# Patient Record
Sex: Male | Born: 1960 | Race: White | Hispanic: No | Marital: Married | Smoking: Current every day smoker
Health system: Southern US, Community
[De-identification: ages and names within clinical notes are randomized; demographics above are authoritative.]

## PROBLEM LIST (undated history)

## (undated) DIAGNOSIS — I4891 Unspecified atrial fibrillation: Secondary | ICD-10-CM

## (undated) DIAGNOSIS — I712 Thoracic aortic aneurysm, without rupture: Secondary | ICD-10-CM

## (undated) DIAGNOSIS — E119 Type 2 diabetes mellitus without complications: Secondary | ICD-10-CM

## (undated) DIAGNOSIS — Q2381 Bicuspid aortic valve: Secondary | ICD-10-CM

## (undated) DIAGNOSIS — E785 Hyperlipidemia, unspecified: Secondary | ICD-10-CM

## (undated) DIAGNOSIS — I639 Cerebral infarction, unspecified: Secondary | ICD-10-CM

## (undated) DIAGNOSIS — D869 Sarcoidosis, unspecified: Secondary | ICD-10-CM

## (undated) DIAGNOSIS — I7121 Aneurysm of the ascending aorta, without rupture: Secondary | ICD-10-CM

## (undated) DIAGNOSIS — Q231 Congenital insufficiency of aortic valve: Secondary | ICD-10-CM

## (undated) HISTORY — PX: OTHER SURGICAL HISTORY: SHX169

## (undated) HISTORY — DX: Thoracic aortic aneurysm, without rupture: I71.2

## (undated) HISTORY — DX: Sarcoidosis, unspecified: D86.9

## (undated) HISTORY — DX: Hyperlipidemia, unspecified: E78.5

## (undated) HISTORY — DX: Bicuspid aortic valve: Q23.81

## (undated) HISTORY — DX: Congenital insufficiency of aortic valve: Q23.1

## (undated) HISTORY — PX: LUNG BIOPSY: SHX232

## (undated) HISTORY — DX: Unspecified atrial fibrillation: I48.91

## (undated) HISTORY — PX: VASECTOMY: SHX75

## (undated) HISTORY — DX: Aneurysm of the ascending aorta, without rupture: I71.21

## (undated) HISTORY — PX: PALATE / UVULA BIOPSY / EXCISION: SUR128

## (undated) HISTORY — DX: Type 2 diabetes mellitus without complications: E11.9

## (undated) HISTORY — PX: TONSILLECTOMY: SUR1361

---

## 2018-01-22 LAB — TSH: TSH: 5 (ref 0.41–5.90)

## 2018-01-22 LAB — LIPID PANEL
Cholesterol: 141 (ref 0–200)
HDL: 49 (ref 35–70)
LDL Cholesterol: 79
Triglycerides: 89 (ref 40–160)

## 2018-01-22 LAB — HEMOGLOBIN A1C: Hemoglobin A1C: 14.4

## 2018-01-22 LAB — BASIC METABOLIC PANEL
BUN: 15 (ref 4–21)
CREATININE: 1.1 (ref 0.6–1.3)

## 2018-02-04 ENCOUNTER — Other Ambulatory Visit: Payer: Self-pay | Admitting: Orthopedic Surgery

## 2018-02-04 DIAGNOSIS — R2232 Localized swelling, mass and lump, left upper limb: Secondary | ICD-10-CM

## 2018-02-11 ENCOUNTER — Ambulatory Visit
Admission: RE | Admit: 2018-02-11 | Discharge: 2018-02-11 | Disposition: A | Discharge status: Home / Self Care | Payer: Managed Care, Other (non HMO) | Source: Ambulatory Visit | Attending: Orthopedic Surgery | Admitting: Orthopedic Surgery

## 2018-02-11 DIAGNOSIS — R2232 Localized swelling, mass and lump, left upper limb: Secondary | ICD-10-CM

## 2018-03-14 ENCOUNTER — Ambulatory Visit: Payer: Managed Care, Other (non HMO) | Admitting: Cardiology

## 2018-03-14 ENCOUNTER — Encounter: Payer: Self-pay | Admitting: Cardiology

## 2018-03-14 VITALS — BP 134/86 | HR 68 | Ht 72.0 in | Wt 166.8 lb

## 2018-03-14 DIAGNOSIS — R011 Cardiac murmur, unspecified: Secondary | ICD-10-CM

## 2018-03-14 DIAGNOSIS — R002 Palpitations: Secondary | ICD-10-CM | POA: Diagnosis not present

## 2018-03-14 DIAGNOSIS — Z8679 Personal history of other diseases of the circulatory system: Secondary | ICD-10-CM | POA: Diagnosis not present

## 2018-03-14 DIAGNOSIS — E1165 Type 2 diabetes mellitus with hyperglycemia: Secondary | ICD-10-CM

## 2018-03-14 DIAGNOSIS — Z862 Personal history of diseases of the blood and blood-forming organs and certain disorders involving the immune mechanism: Secondary | ICD-10-CM

## 2018-03-14 NOTE — Patient Instructions (Addendum)
Medication Instructions:   Your physician recommends that you continue on your current medications as directed. Please refer to the Current Medication list given to you today.  Labwork:  NONE  Testing/Procedures: .Your physician has requested that you have an echocardiogram. Echocardiography is a painless test that uses sound waves to create images of your heart. It provides your doctor with information about the size and shape of your heart and how well your heart's chambers and valves are working. This procedure takes approximately one hour. There are no restrictions for this procedure. Your physician has recommended that you wear an event monitor for 7 days. Event monitors are medical devices that record the heart's electrical activity. Doctors most often us these monitors to diagnose arrhythmias. Arrhythmias are problems with the speed or rhythm of the heartbeat. The monitor is a small, portable device. You can wear one while you do your normal daily activities. This is usually used to diagnose what is causing palpitations/syncope (passing out).  Follow-Up:  Your physician recommends that you schedule a follow-up appointment in: 6 weeks.  Any Other Special Instructions Will Be Listed Below (If Applicable).  If you need a refill on your cardiac medications before your next appointment, please call your pharmacy.

## 2018-03-14 NOTE — Progress Notes (Signed)
Cardiology Office Note  Date: 03/14/2018   ID: Jeffery Beard, DOB 11/08/1960, MRN [id number]  PCP: Jeffery Stabile, MD  Consulting Cardiologist: Jeffery Dell, MD   Chief Complaint  Patient presents with  . Palpitations    History of Present Illness: Jeffery Beard is a 57 y.o. male referred for cardiology consultation by Dr. Margo Beard with palpitations and reported history of atrial fibrillation.  We discussed his history, I have no old records.  He tells me that he was diagnosed with atrial fibrillation back in his 9s, was apparently treated with medical therapy and had recurring events over several years.  He denies ever having undergone a cardioversion or being on any antiarrhythmic medications.  It is unclear that he has had any follow-up for this for several years.  He also reports having a "bicuspid valve," presumably aortic, and again not followed for several years.  He recalls having symptoms at rest while watching television, rapid irregular heartbeat and feeling of tightness in his chest, he thought he was having a "heart attack."  He did not seek medical attention but made a routine visit with his PCP resulting in this referral.  He does not report having similar symptoms since then, but has a longstanding recurring history of palpitations.  He describes a skipped beat at times, other times a more rapid heartbeat.  No dizziness or syncope.  History also includes sarcoidosis, it sounds like he underwent lung biopsy for diagnosis and some type of surgical treatment and subsequent medical therapy.  He has had no follow-up for this in several years.  I personally reviewed his ECG today which shows normal sinus rhythm with PAC.  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  He moved to this area from [place] within the last few years.  Past Medical History:  Diagnosis Date  . Atrial fibrillation (HCC)    Details unclear - patient states diagnosed in his 64s  . Bicuspid aortic  valve   . Hyperlipidemia   . Sarcoidosis   . Type 2 diabetes mellitus (HCC)     Past Surgical History:  Procedure Laterality Date  . LUNG BIOPSY    . Nasal septum surgery    . PALATE / UVULA BIOPSY / EXCISION    . TONSILLECTOMY    . VASECTOMY      Current Outpatient Medications  Medication Sig Dispense Refill  . CHANTIX STARTING MONTH PAK 0.5 MG X 11 & 1 MG X 42 tablet     . Insulin Glargine, 1 Unit Dial, (TOUJEO SOLOSTAR) 300 UNIT/ML SOPN INJECT 65 UNITS [place] QHS    . Insulin Lispro (HUMALOG KWIKPEN) 200 UNIT/ML SOPN      No current facility-administered medications for this visit.    Allergies:  Patient has no allergy information on record.   Social History: The patient  reports that he has been smoking cigarettes. He has been smoking about 1.00 pack per day. He has never used smokeless tobacco. He reports that he does not drink alcohol or use drugs.   Family History: The patient's family history includes COPD in his mother; Cancer - Prostate in his father; High blood pressure in his father and mother.   ROS:  Please see the history of present illness. Otherwise, complete review of systems is positive for none.  All other systems are reviewed and negative.   Physical Exam: VS:  BP 134/86   Pulse 68   Ht 6' (1.829 m)   Wt 166 lb 12.8 oz (  75.7 kg)   SpO2 97%   BMI 22.62 kg/m , BMI Body mass index is 22.62 kg/m.  Wt Readings from Last 3 Encounters:  03/14/18 166 lb 12.8 oz (75.7 kg)    General: Patient appears comfortable at rest. HEENT: Conjunctiva and lids normal, oropharynx clear. Neck: Supple, no elevated JVP or carotid bruits, no thyromegaly. Lungs: Clear to auscultation, nonlabored breathing at rest. Cardiac: Regular rate and rhythm, no S3, 2/6 systolic murmur, no pericardial rub. Abdomen: Soft, nontender, bowel sounds present, no guarding or rebound. Extremities: No pitting edema, distal pulses 2+. Skin: Warm and dry. Musculoskeletal: No  kyphosis. Neuropsychiatric: Alert and oriented x3, affect grossly appropriate.  ECG: There is no old tracing available for comparison.  Recent Labwork:  October 2019: Hemoglobin 16.9, platelets 188, BUN 15, creatinine 1.1, potassium 4.5, AST 14, ALT 18, cholesterol 141, triglycerides 89, HDL 49, LDL 74, hemoglobin A1c 14.4%, TSH 5.0  Assessment and Plan:  1.  Recurring palpitations, chronic and in the setting of reported paroxysmal atrial fibrillation.  I have no old records for review.  His ECG today shows sinus rhythm with a single PAC.  Plan is to obtain a 7-day event monitor, he reports daily symptoms.  Hopefully, this will better characterize whether his symptoms correspond with atrial fibrillation or perhaps just ectopy.  2.  Presumed bicuspid aortic valve.  An echocardiogram will be obtained for further evaluation.  3.  Type 2 diabetes mellitus, following with PCP at this point and with endocrinology consultation pending.  He is on insulin.  4.  Reported history of sarcoidosis, no recent follow-up.  [place]  I recommended that he talk with his PCP about arranging at least a follow-up chest x-ray.   Current medicines were reviewed with the patient today.   Orders Placed This Encounter  Procedures  . CARDIAC EVENT MONITOR  . EKG 12-Lead  . ECHOCARDIOGRAM COMPLETE    Disposition: Follow-up in 6 weeks.  Signed, Jonelle SidleSamuel G. McDowell, MD, [facility]ACC 03/14/2018 3:38 PM    [facility] [address], [place], [place] [address] Phone: [phone]; Fax: [phone]

## 2018-03-16 ENCOUNTER — Ambulatory Visit (HOSPITAL_COMMUNITY): Payer: Managed Care, Other (non HMO)

## 2018-03-17 ENCOUNTER — Ambulatory Visit (HOSPITAL_COMMUNITY)
Admission: RE | Admit: 2018-03-17 | Discharge: 2018-03-17 | Disposition: A | Discharge status: Home / Self Care | Payer: Managed Care, Other (non HMO) | Source: Ambulatory Visit | Attending: Cardiology | Admitting: Cardiology

## 2018-03-17 DIAGNOSIS — R011 Cardiac murmur, unspecified: Secondary | ICD-10-CM | POA: Diagnosis present

## 2018-03-17 NOTE — Progress Notes (Signed)
*  PRELIMINARY RESULTS* Echocardiogram 2D Echocardiogram has been performed.  Stacey DrainWhite, Solyana Nonaka J 03/17/2018, 3:53 PM

## 2018-03-22 ENCOUNTER — Telehealth: Payer: Self-pay

## 2018-03-22 ENCOUNTER — Telehealth: Payer: Self-pay | Admitting: Cardiology

## 2018-03-22 NOTE — Telephone Encounter (Signed)
Result released

## 2018-03-22 NOTE — Telephone Encounter (Signed)
Patient is asking when the results for his Echo will be uploaded to MyChart. / tg

## 2018-03-22 NOTE — Telephone Encounter (Signed)
Results released to mychart

## 2018-03-23 ENCOUNTER — Encounter: Payer: Self-pay | Admitting: "Endocrinology

## 2018-03-23 ENCOUNTER — Ambulatory Visit (INDEPENDENT_AMBULATORY_CARE_PROVIDER_SITE_OTHER): Payer: Managed Care, Other (non HMO) | Admitting: "Endocrinology

## 2018-03-23 VITALS — BP 135/76 | HR 68 | Ht 72.0 in | Wt 162.0 lb

## 2018-03-23 DIAGNOSIS — E1165 Type 2 diabetes mellitus with hyperglycemia: Secondary | ICD-10-CM | POA: Diagnosis not present

## 2018-03-23 DIAGNOSIS — E782 Mixed hyperlipidemia: Secondary | ICD-10-CM

## 2018-03-23 DIAGNOSIS — F17200 Nicotine dependence, unspecified, uncomplicated: Secondary | ICD-10-CM

## 2018-03-23 NOTE — Patient Instructions (Signed)
                                       Advice for diabetes /Weight Management   -Sticking to a routine mealtime to eat 3 meals a day and avoiding unnecessary snacks is shown to have a big role in weight control.  -It is better to avoid simple carbohydrates including: Cakes, Sweet Desserts, Ice Cream, Soda (diet and regular), Sweet Tea, Candies, Chips, Cookies, Store Bought Juices, Alcohol in Excess of  1-2 drinks a day, Artificial Sweeteners, Doughnuts, Coffee Creamers, "Sugar-free" Products, etc, etc.  This is not a complete list...Marland Kitchen..    -Consulting with certified diabetes educators is proven to provide you with the most accurate and current information on diet.  Also, you may be  interested in discussing diet options/exchanges , we can schedule a visit with Norm SaltPenny Crumpton, RDN, CDE for individualized nutrition education.     - Studies showed that people with diabetes will benefit from a class of medications known as ACE inhibitors and statins.  Unless there are specific reasons not to be on these medications, the standard of care is to consider getting one from these groups of medications at a minimum dose.  These medications are generally considered safe and proven to help protect the heart and the kidneys.    - People with diabetes are encouraged to initiate and maintain regular follow-up with eye doctors, foot doctors, dentists , and if necessary heart and kidney doctors.     - It is highly recommended that people with diabetes quit smoking or stay away from smoking, and get yearly  flu vaccine and pneumonia vaccine at least every 5 years.  One other important lifestyle recommendation is to ensure adequate sleep - at least 7 hours of uninterrupted sleep at night.  -Exercise: If you are able: 30 -60 minutes a day, 4 days a week, or 150 minutes a week.  The longer the better.  Combine stretch, strength, and aerobic activities.  If you were told in the past  that you have high risk for cardiovascular diseases, you may seek evaluation by your heart doctor prior to initiating moderate to intense exercise programs.

## 2018-03-23 NOTE — Progress Notes (Signed)
Endocrinology Consult Note       03/23/2018, 4:54 PM   Subjective:    Patient ID: Jeffery Beard, male    DOB: 02/11/1961.  Jeffery Beard is being seen in consultation for management of currently uncontrolled symptomatic diabetes requested by  Benita Stabile, MD.   Past Medical History:  Diagnosis Date  . Atrial fibrillation (HCC)    Details unclear - patient states diagnosed in his 98s  . Bicuspid aortic valve   . Hyperlipidemia   . Sarcoidosis   . Type 2 diabetes mellitus (HCC)    Past Surgical History:  Procedure Laterality Date  . LUNG BIOPSY    . Nasal septum surgery    . PALATE / UVULA BIOPSY / EXCISION    . TONSILLECTOMY    . VASECTOMY     Social History   Socioeconomic History  . Marital status: Married    Spouse name: Not on file  . Number of children: Not on file  . Years of education: Not on file  . Highest education level: Not on file  Occupational History  . Not on file  Social Needs  . Financial resource strain: Not on file  . Food insecurity:    Worry: Not on file    Inability: Not on file  . Transportation needs:    Medical: Not on file    Non-medical: Not on file  Tobacco Use  . Smoking status: Current Every Day Smoker    Packs/day: 1.00    Years: 25.00    Pack years: 25.00    Types: Cigarettes  . Smokeless tobacco: Never Used  Substance and Sexual Activity  . Alcohol use: Never    Frequency: Never  . Drug use: Never  . Sexual activity: Not on file  Lifestyle  . Physical activity:    Days per week: Not on file    Minutes per session: Not on file  . Stress: Not on file  Relationships  . Social connections:    Talks on phone: Not on file    Gets together: Not on file    Attends religious service: Not on file    Active member of club or organization: Not on file    Attends meetings of clubs or organizations: Not on file    Relationship status: Not  on file  Other Topics Concern  . Not on file  Social History Narrative  . Not on file   Outpatient Encounter Medications as of 03/23/2018  Medication Sig  . CHANTIX STARTING MONTH PAK 0.5 MG X 11 & 1 MG X 42 tablet   . Insulin Glargine, 1 Unit Dial, (TOUJEO SOLOSTAR) 300 UNIT/ML SOPN Inject 60 Units into the skin at bedtime.  . Insulin Lispro (HUMALOG KWIKPEN) 200 UNIT/ML SOPN Inject 10-16 Units into the skin 3 (three) times daily before meals.   No facility-administered encounter medications on file as of 03/23/2018.     ALLERGIES: Allergies  Allergen Reactions  . Codeine   . Hydrocodone-Acetaminophen     Other reaction(s): Other (See Comments) Nervousness and itching    VACCINATION STATUS:  There is no immunization history on file  for this patient.  Diabetes  He presents for his initial diabetic visit. He has type 2 diabetes mellitus. Onset time: He was diagnosed at approximate age of 50 years. There are no hypoglycemic associated symptoms. Pertinent negatives for hypoglycemia include no confusion, headaches, pallor or seizures. Associated symptoms include polydipsia and polyuria. Pertinent negatives for diabetes include no chest pain, no fatigue, no polyphagia and no weakness. There are no hypoglycemic complications. Symptoms are worsening. Risk factors for coronary artery disease include diabetes mellitus, dyslipidemia, male sex, tobacco exposure and sedentary lifestyle. Current diabetic treatment includes insulin injections (He is currently on Toujeo 40 units twice daily, Humalog 15 units 3 times daily AC.). His weight is fluctuating minimally. He is following a generally unhealthy diet. When asked about meal planning, he reported none. He has not had a previous visit with a dietitian. He rarely participates in exercise. His home blood glucose trend is increasing steadily. (He did not bring any meter nor logs to review.  His most recent A1c was 14.4% on January 22, 2018.) An ACE  inhibitor/angiotensin II receptor blocker is not being taken.  Hyperlipidemia  This is a chronic problem. The current episode started more than 1 year ago. The problem is controlled. Exacerbating diseases include diabetes. Pertinent negatives include no chest pain, myalgias or shortness of breath. Risk factors for coronary artery disease include diabetes mellitus, dyslipidemia, male sex and a sedentary lifestyle.      Review of Systems  Constitutional: Negative for chills, fatigue, fever and unexpected weight change.  HENT: Negative for dental problem, mouth sores and trouble swallowing.   Eyes: Negative for visual disturbance.  Respiratory: Negative for cough, choking, chest tightness, shortness of breath and wheezing.   Cardiovascular: Negative for chest pain, palpitations and leg swelling.  Gastrointestinal: Negative for abdominal distention, abdominal pain, constipation, diarrhea, nausea and vomiting.  Endocrine: Positive for polydipsia and polyuria. Negative for polyphagia.  Genitourinary: Negative for dysuria, flank pain, hematuria and urgency.  Musculoskeletal: Negative for back pain, gait problem, myalgias and neck pain.  Skin: Negative for pallor, rash and wound.  Neurological: Negative for seizures, syncope, weakness, numbness and headaches.  Psychiatric/Behavioral: Negative for confusion and dysphoric mood.    Objective:    BP 135/76   Pulse 68   Ht 6' (1.829 m)   Wt 162 lb (73.5 kg)   BMI 21.97 kg/m   Wt Readings from Last 3 Encounters:  03/23/18 162 lb (73.5 kg)  03/14/18 166 lb 12.8 oz (75.7 kg)     Physical Exam Constitutional:      General: He is not in acute distress.    Appearance: He is well-developed.  HENT:     Head: Normocephalic and atraumatic.  Neck:     Musculoskeletal: Normal range of motion and neck supple.     Thyroid: No thyromegaly.     Trachea: No tracheal deviation.  Cardiovascular:     Rate and Rhythm: Normal rate.     Pulses:           Dorsalis pedis pulses are 1+ on the right side and 1+ on the left side.       Posterior tibial pulses are 1+ on the right side and 1+ on the left side.     Heart sounds: Normal heart sounds, S1 normal and S2 normal. No murmur. No gallop.   Pulmonary:     Effort: No respiratory distress.     Breath sounds: Normal breath sounds. No wheezing.  Abdominal:  General: Bowel sounds are normal. There is no distension.     Palpations: Abdomen is soft.     Tenderness: There is no abdominal tenderness. There is no guarding.  Musculoskeletal:        General: No swelling, deformity or signs of injury.     Right shoulder: He exhibits no swelling and no deformity.     Right lower leg: No edema.     Left lower leg: No edema.     Comments: His foot exam is negative for neuropathy nor PAD.  Skin:    General: Skin is warm and dry.     Findings: No rash.     Nails: There is no clubbing.   Neurological:     Mental Status: He is alert and oriented to person, place, and time.     Cranial Nerves: No cranial nerve deficit.     Sensory: No sensory deficit.     Gait: Gait normal.     Deep Tendon Reflexes: Reflexes are normal and symmetric.  Psychiatric:        Speech: Speech normal.        Behavior: Behavior normal. Behavior is cooperative.        Judgment: Judgment normal.    Recent Results (from the past 2160 hour(s))  Basic metabolic panel     Status: None   Collection Time: 01/22/18 12:00 AM  Result Value Ref Range   BUN 15 4 - 21   Creatinine 1.1 0.6 - 1.3  Lipid panel     Status: None   Collection Time: 01/22/18 12:00 AM  Result Value Ref Range   Triglycerides 89 40 - 160   Cholesterol 141 0 - 200   HDL 49 35 - 70   LDL Cholesterol 79   Hemoglobin A1c     Status: None   Collection Time: 01/22/18 12:00 AM  Result Value Ref Range   Hemoglobin A1C 14.4   TSH     Status: None   Collection Time: 01/22/18 12:00 AM  Result Value Ref Range   TSH 5.00 0.41 - 5.90     Assessment & Plan:    1. Uncontrolled type 2 diabetes mellitus with hyperglycemia (HCC)  - Jeffery Beard has currently uncontrolled symptomatic type 2 DM since 57 years of age,  with most recent A1c of 14.4 %.  Patient denies prior history of diabetes ketoacidosis.  Patient has history of alcohol use/abuse.  His maximum body weight has been 210 pounds with subsequent significant unintentional weight loss down to 147.  Recently regaining to 162.  Recent labs reviewed. - I had a long discussion with him about the progressive nature of diabetes and the pathology behind its complications. -his diabetes is complicated by heavy smoking and he remains at a high risk for more acute and chronic complications which include CAD, CVA, CKD, retinopathy, and neuropathy. These are all discussed in detail with him.  - I have counseled him on diet management by adopting a carbohydrate restricted/protein rich diet.  - Suggestion is made for him to avoid simple carbohydrates  from his diet including Cakes, Sweet Desserts, Ice Cream, Soda (diet and regular), Sweet Tea, Candies, Chips, Cookies, Store Bought Juices, Alcohol in Excess of  1-2 drinks a day, Artificial Sweeteners,  Coffee Creamer, and "Sugar-free" Products. This will help patient to have more stable blood glucose profile and potentially avoid unintended weight gain.  - I encouraged him to switch to  unprocessed or minimally processed complex starch and increased  protein intake (animal or plant source), fruits, and vegetables.  - he is advised to stick to a routine mealtimes to eat 3 meals  a day and avoid unnecessary snacks ( to snack only to correct hypoglycemia).   - he will be scheduled with Norm Salt, RDN, CDE for individualized diabetes education.  Weight loss is not advisable for him.  - I have approached him with the following individualized plan to manage diabetes and patient agrees:   -Given his prevailing glycemic burden, he will continue to require  intensive treatment with basal/bolus insulin. -I discussed and readjusted his Toujeo to 60 units nightly, Humalog 10 units 3 times a day with meals  for pre-meal BG readings of 90-150mg /dl, plus patient specific correction dose for unexpected hyperglycemia above 150mg /dl, associated with strict monitoring of glucose 4 times a day-before meals and at bedtime. - he is warned not to take insulin without proper monitoring per orders. - Adjustment parameters are given to him for hypo and hyperglycemia in writing. - he is encouraged to call clinic for blood glucose levels less than 70 or above 300 mg /dl. -He will be treated exclusively with insulin for now.  His clinical presentation is not typical for type I nor type 2 diabetes.  He will need further studies to classify his diabetes properly.   - Patient specific target  A1c;  LDL, HDL, Triglycerides, and  Waist Circumference were discussed in detail.  2) Blood Pressure /Hypertension:  his blood pressure is controlled to target.  He is not on any blood pressure medications.  3) Lipids/Hyperlipidemia:   Review of his recent lipid panel showed controlled  LDL at 74.  he  is not on statins.  He will be considered for low-dose statin therapy during subsequent visits.     4)  Weight/Diet:  Body mass index is 21.97 kg/m.   Weight loss is not advisable for him.  CDE Consult will be initiated . Exercise, and detailed carbohydrates information provided  -  detailed on discharge instructions.  5) subclinical hypothyroidism -He is currently not on any antithyroid agents nor thyroid hormone supplements.  He will require repeat full profile thyroid function tests on subsequent visits.  This patient also has history of heavy steroid exposure related to his psoriatic arthritis.  This puts him at risk for adrenal insufficiency, will need studies to ensure adrenal sufficiency.  6) Chronic Care/Health Maintenance:  -he  Is not not on ACEI nor  Statin medications,   encouraged to initiate and continue to follow up with Ophthalmology, Dentist,  Podiatrist at least yearly or according to recommendations, and advised to  Quit smoking. I have recommended yearly flu vaccine and pneumonia vaccine at least every 5 years; moderate intensity exercise for up to 150 minutes weekly; and  sleep for at least 7 hours a day.  - he is  advised to maintain close follow up with Benita Stabile, MD for primary care needs, as well as his other providers for optimal and coordinated care.  - Time spent with the patient: 35 minutes, of which >50% was spent in obtaining information about his symptoms, reviewing his previous labs, evaluations, and treatments, counseling him about his uncontrolled diabetes, hyperlipidemia, and developing plans for long term treatment based on the latest recommendations.  Jeffery Beard participated in the discussions, expressed understanding, and voiced agreement with the above plans.  All questions were answered to his satisfaction. he is encouraged to contact clinic should he have any questions or concerns prior  to his return visit.  Follow up plan: - Return in about 10 days (around 04/02/2018) for Follow up with Meter and Logs Only - no Labs.  Jeffery Lunch, MD [facility] [address] [place], [place] [address] Phone: [phone]  Fax: [phone]    03/23/2018, 4:54 PM  This note was partially dictated with voice recognition software. Similar sounding words can be transcribed inadequately or may not  be corrected upon review.

## 2018-03-24 DIAGNOSIS — E1165 Type 2 diabetes mellitus with hyperglycemia: Secondary | ICD-10-CM | POA: Insufficient documentation

## 2018-03-24 DIAGNOSIS — F17200 Nicotine dependence, unspecified, uncomplicated: Secondary | ICD-10-CM | POA: Insufficient documentation

## 2018-03-24 DIAGNOSIS — E782 Mixed hyperlipidemia: Secondary | ICD-10-CM | POA: Insufficient documentation

## 2018-04-05 ENCOUNTER — Ambulatory Visit: Payer: Managed Care, Other (non HMO) | Admitting: "Endocrinology

## 2018-04-11 ENCOUNTER — Encounter: Payer: Self-pay | Admitting: "Endocrinology

## 2018-04-11 ENCOUNTER — Ambulatory Visit: Payer: Managed Care, Other (non HMO) | Admitting: "Endocrinology

## 2018-04-11 VITALS — BP 167/97 | HR 74 | Ht 72.0 in | Wt 171.0 lb

## 2018-04-11 DIAGNOSIS — E1165 Type 2 diabetes mellitus with hyperglycemia: Secondary | ICD-10-CM | POA: Diagnosis not present

## 2018-04-11 DIAGNOSIS — E782 Mixed hyperlipidemia: Secondary | ICD-10-CM

## 2018-04-11 MED ORDER — INSULIN GLARGINE (1 UNIT DIAL) 300 UNIT/ML ~~LOC~~ SOPN: 60.0000 [IU] | PEN_INJECTOR | Freq: Every day | SUBCUTANEOUS | 2 refills | Status: DC

## 2018-04-11 MED ORDER — INSULIN LISPRO 200 UNIT/ML ~~LOC~~ SOPN: 8.0000 [IU] | PEN_INJECTOR | Freq: Three times a day (TID) | SUBCUTANEOUS | 2 refills | Status: DC

## 2018-04-11 NOTE — Patient Instructions (Signed)

## 2018-04-11 NOTE — Progress Notes (Signed)
Endocrinology follow-up note       04/11/2018, 4:47 PM   Subjective:    Patient ID: Jeffery Beard, male    DOB: 09-16-1960.  Sudarshan Delpino is being seen in consultation for management of currently uncontrolled symptomatic diabetes requested by  Benita Stabile, MD.   Past Medical History:  Diagnosis Date  . Atrial fibrillation (HCC)    Details unclear - patient states diagnosed in his 58s  . Bicuspid aortic valve   . Hyperlipidemia   . Sarcoidosis   . Type 2 diabetes mellitus (HCC)    Past Surgical History:  Procedure Laterality Date  . LUNG BIOPSY    . Nasal septum surgery    . PALATE / UVULA BIOPSY / EXCISION    . TONSILLECTOMY    . VASECTOMY     Social History   Socioeconomic History  . Marital status: Married    Spouse name: Not on file  . Number of children: Not on file  . Years of education: Not on file  . Highest education level: Not on file  Occupational History  . Not on file  Social Needs  . Financial resource strain: Not on file  . Food insecurity:    Worry: Not on file    Inability: Not on file  . Transportation needs:    Medical: Not on file    Non-medical: Not on file  Tobacco Use  . Smoking status: Current Every Day Smoker    Packs/day: 1.00    Years: 25.00    Pack years: 25.00    Types: Cigarettes  . Smokeless tobacco: Never Used  Substance and Sexual Activity  . Alcohol use: Never    Frequency: Never  . Drug use: Never  . Sexual activity: Not on file  Lifestyle  . Physical activity:    Days per week: Not on file    Minutes per session: Not on file  . Stress: Not on file  Relationships  . Social connections:    Talks on phone: Not on file    Gets together: Not on file    Attends religious service: Not on file    Active member of club or organization: Not on file    Attends meetings of clubs or organizations: Not on file    Relationship status: Not  on file  Other Topics Concern  . Not on file  Social History Narrative  . Not on file   Outpatient Encounter Medications as of 04/11/2018  Medication Sig  . CHANTIX STARTING MONTH PAK 0.5 MG X 11 & 1 MG X 42 tablet   . Insulin Glargine, 1 Unit Dial, (TOUJEO SOLOSTAR) 300 UNIT/ML SOPN Inject 60 Units into the skin at bedtime.  . Insulin Lispro (HUMALOG KWIKPEN) 200 UNIT/ML SOPN Inject 8-14 Units into the skin 3 (three) times daily before meals.  . [DISCONTINUED] Insulin Glargine, 1 Unit Dial, (TOUJEO SOLOSTAR) 300 UNIT/ML SOPN Inject 60 Units into the skin at bedtime.  . [DISCONTINUED] Insulin Lispro (HUMALOG KWIKPEN) 200 UNIT/ML SOPN Inject 8-14 Units into the skin 3 (three) times daily before meals.   No facility-administered encounter medications on file as  of 04/11/2018.     ALLERGIES: Allergies  Allergen Reactions  . Codeine   . Hydrocodone-Acetaminophen     Other reaction(s): Other (See Comments) Nervousness and itching    VACCINATION STATUS:  There is no immunization history on file for this patient.  Diabetes  He presents for his follow-up diabetic visit. He has type 2 diabetes mellitus. Onset time: He was diagnosed at approximate age of 50 years. His disease course has been improving. There are no hypoglycemic associated symptoms. Pertinent negatives for hypoglycemia include no confusion, headaches, pallor or seizures. Pertinent negatives for diabetes include no chest pain, no fatigue, no polydipsia, no polyphagia, no polyuria and no weakness. There are no hypoglycemic complications. Symptoms are improving. Risk factors for coronary artery disease include diabetes mellitus, dyslipidemia, male sex, tobacco exposure and sedentary lifestyle. Current diabetic treatment includes insulin injections (He is currently on Toujeo 40 units twice daily, Humalog 15 units 3 times daily AC.). His weight is increasing steadily. He is following a generally unhealthy diet. When asked about meal  planning, he reported none. He has not had a previous visit with a dietitian. He rarely participates in exercise. His home blood glucose trend is fluctuating minimally. (He brings in his logs showing improved glycemic profile.    His most recent A1c was 14.4% on January 22, 2018.  He made Humalog dosing errors, continue to use 10 units of Humalog regardless of his pre-meal blood glucose readings because some random hypoglycemia in the range of 50s and 60s.) An ACE inhibitor/angiotensin II receptor blocker is not being taken.  Hyperlipidemia  This is a chronic problem. The current episode started more than 1 year ago. The problem is controlled. Exacerbating diseases include diabetes. Pertinent negatives include no chest pain, myalgias or shortness of breath. Risk factors for coronary artery disease include diabetes mellitus, dyslipidemia, male sex and a sedentary lifestyle.    Review of Systems  Constitutional: Negative for chills, fatigue, fever and unexpected weight change.  HENT: Negative for dental problem, mouth sores and trouble swallowing.   Eyes: Negative for visual disturbance.  Respiratory: Negative for cough, choking, chest tightness, shortness of breath and wheezing.   Cardiovascular: Negative for chest pain, palpitations and leg swelling.  Gastrointestinal: Negative for abdominal distention, abdominal pain, constipation, diarrhea, nausea and vomiting.  Endocrine: Negative for polydipsia, polyphagia and polyuria.  Genitourinary: Negative for dysuria, flank pain, hematuria and urgency.  Musculoskeletal: Negative for back pain, gait problem, myalgias and neck pain.  Skin: Negative for pallor, rash and wound.  Neurological: Negative for seizures, syncope, weakness, numbness and headaches.  Psychiatric/Behavioral: Negative for confusion and dysphoric mood.    Objective:    BP (!) 167/97   Pulse 74   Ht 6' (1.829 m)   Wt 171 lb (77.6 kg)   BMI 23.19 kg/m   Wt Readings from Last 3  Encounters:  04/11/18 171 lb (77.6 kg)  03/23/18 162 lb (73.5 kg)  03/14/18 166 lb 12.8 oz (75.7 kg)     Physical Exam Constitutional:      General: He is not in acute distress.    Appearance: He is well-developed.  HENT:     Head: Normocephalic and atraumatic.  Neck:     Musculoskeletal: Normal range of motion and neck supple.     Thyroid: No thyromegaly.     Trachea: No tracheal deviation.  Cardiovascular:     Rate and Rhythm: Normal rate.     Pulses:  Dorsalis pedis pulses are 1+ on the right side and 1+ on the left side.       Posterior tibial pulses are 1+ on the right side and 1+ on the left side.     Heart sounds: Normal heart sounds, S1 normal and S2 normal. No murmur. No gallop.   Pulmonary:     Effort: No respiratory distress.     Breath sounds: Normal breath sounds. No wheezing.  Abdominal:     General: Bowel sounds are normal. There is no distension.     Palpations: Abdomen is soft.     Tenderness: There is no abdominal tenderness. There is no guarding.  Musculoskeletal:        General: No swelling, deformity or signs of injury.     Right shoulder: He exhibits no swelling and no deformity.     Right lower leg: No edema.     Left lower leg: No edema.     Comments: His foot exam is negative for neuropathy nor PAD.  Skin:    General: Skin is warm and dry.     Findings: No rash.     Nails: There is no clubbing.   Neurological:     Mental Status: He is alert and oriented to person, place, and time.     Cranial Nerves: No cranial nerve deficit.     Sensory: No sensory deficit.     Gait: Gait normal.     Deep Tendon Reflexes: Reflexes are normal and symmetric.  Psychiatric:        Speech: Speech normal.        Behavior: Behavior normal. Behavior is cooperative.        Judgment: Judgment normal.    Recent Results (from the past 2160 hour(s))  Basic metabolic panel     Status: None   Collection Time: 01/22/18 12:00 AM  Result Value Ref Range   BUN 15  4 - 21   Creatinine 1.1 0.6 - 1.3  Lipid panel     Status: None   Collection Time: 01/22/18 12:00 AM  Result Value Ref Range   Triglycerides 89 40 - 160   Cholesterol 141 0 - 200   HDL 49 35 - 70   LDL Cholesterol 79   Hemoglobin A1c     Status: None   Collection Time: 01/22/18 12:00 AM  Result Value Ref Range   Hemoglobin A1C 14.4   TSH     Status: None   Collection Time: 01/22/18 12:00 AM  Result Value Ref Range   TSH 5.00 0.41 - 5.90     Assessment & Plan:   1. Uncontrolled type 2 diabetes mellitus with hyperglycemia (HCC)  - Milas Kocherhillip Flores has currently uncontrolled symptomatic type 2 DM since 58 years of age.  -He presents with improved glycemic profile despite his most recent A1c of 14.4 %.  Patient denies prior history of diabetes ketoacidosis.  Patient has history of alcohol use/abuse.  His maximum body weight has been 210 pounds with subsequent significant unintentional weight loss down to 147.  Recently regaining to 162.  Recent labs reviewed. - I had a long discussion with him about the progressive nature of diabetes and the pathology behind its complications. -his diabetes is complicated by heavy smoking and he remains at a high risk for more acute and chronic complications which include CAD, CVA, CKD, retinopathy, and neuropathy. These are all discussed in detail with him.  - I have counseled him on diet management by adopting  a carbohydrate restricted/protein rich diet.  Has gained 9 pounds since last visit, which is a good development for him.  -  Suggestion is made for him to avoid simple carbohydrates  from his diet including Cakes, Sweet Desserts / Pastries, Ice Cream, Soda (diet and regular), Sweet Tea, Candies, Chips, Cookies, Store Bought Juices, Alcohol in Excess of  1-2 drinks a day, Artificial Sweeteners, and "Sugar-free" Products. This will help patient to have stable blood glucose profile and potentially avoid unintended weight gain.   - I encouraged  him to switch to  unprocessed or minimally processed complex starch and increased protein intake (animal or plant source), fruits, and vegetables.  - he is advised to stick to a routine mealtimes to eat 3 meals  a day and avoid unnecessary snacks ( to snack only to correct hypoglycemia).   - he will be scheduled with Norm SaltPenny Crumpton, RDN, CDE for individualized diabetes education.  Weight loss is not advisable for him.  - I have approached him with the following individualized plan to manage diabetes and patient agrees:   -Given his prevailing glycemic burden, he will continue to require intensive treatment with basal/bolus insulin. -He is advised to continue Toujeo at 60 units nightly, lower Humalog to 8 units 3 times a day with meals  for pre-meal BG readings of 90-150mg /dl, plus patient specific correction dose for unexpected hyperglycemia above 150mg /dl, associated with strict monitoring of glucose 4 times a day-before meals and at bedtime. - he is warned not to take insulin without proper monitoring per orders. -Reportedly, he developed severe skin reaction when he applied his 14 days CGM device, and would like to avoid it for now. - Adjustment parameters are given to him for hypo and hyperglycemia in writing. - he is encouraged to call clinic for blood glucose levels less than 70 or above 300 mg /dl. -He will be treated exclusively with insulin for now.  His clinical presentation is not typical for type I nor type 2 diabetes.  He will need further studies to classify his diabetes properly.   - Patient specific target  A1c;  LDL, HDL, Triglycerides, and  Waist Circumference were discussed in detail.  2) Blood Pressure /Hypertension:  his blood pressure is difficult above target today he is not on antihypertensive medications for now.  He will be considered for low-dose lisinopril next visit if blood pressure remains above target.  This patient remains regular smoker.   3)  Lipids/Hyperlipidemia:   Review of his recent lipid panel showed controlled  LDL at 74.  he  is not on statins.  He will be considered for low-dose statin therapy during subsequent visits.     4)  Weight/Diet:  Body mass index is 23.19 kg/m.   Weight loss is not advisable for him.  CDE Consult will be initiated . Exercise, and detailed carbohydrates information provided  -  detailed on discharge instructions.  5) subclinical hypothyroidism -He is currently not on any antithyroid agents nor thyroid hormone supplements.  He will require repeat full profile thyroid function tests on subsequent visits.  This patient also has history of heavy steroid exposure related to his psoriatic arthritis.  This puts him at risk for adrenal insufficiency, will need studies to ensure adrenal sufficiency.  6) Chronic Care/Health Maintenance:  -he  Is not not on ACEI nor  Statin medications,  encouraged to initiate and continue to follow up with Ophthalmology, Dentist,  Podiatrist at least yearly or according to recommendations, and advised to  Quit smoking. I have recommended yearly flu vaccine and pneumonia vaccine at least every 5 years; moderate intensity exercise for up to 150 minutes weekly; and  sleep for at least 7 hours a day.  - he is  advised to maintain close follow up with Benita Stabile, MD for primary care needs, as well as his other providers for optimal and coordinated care.  - Time spent with the patient: 25 min, of which >50% was spent in reviewing his blood glucose logs , discussing his hypo- and hyper-glycemic episodes, reviewing his current and  previous labs and insulin doses and developing a plan to avoid hypo- and hyper-glycemia. Please refer to Patient Instructions for Blood Glucose Monitoring and Insulin/Medications Dosing Guide"  in media tab for additional information. Milas Kocher participated in the discussions, expressed understanding, and voiced agreement with the above plans.  All  questions were answered to his satisfaction. he is encouraged to contact clinic should he have any questions or concerns prior to his return visit.   Follow up plan: - Return in about 9 weeks (around 06/13/2018) for Meter, and Logs, Follow up with Pre-visit Labs, Meter, and Logs.  Marquis Lunch, MD [facility] [address] [place], [place] [address] Phone: [phone]  Fax: [phone]    04/11/2018, 4:47 PM  This note was partially dictated with voice recognition software. Similar sounding words can be transcribed inadequately or may not  be corrected upon review.

## 2018-04-15 ENCOUNTER — Encounter: Payer: Managed Care, Other (non HMO) | Admitting: Vascular Surgery

## 2018-04-21 ENCOUNTER — Telehealth: Payer: Self-pay

## 2018-04-21 NOTE — Telephone Encounter (Signed)
Patient is aware of recommendation 

## 2018-04-21 NOTE — Telephone Encounter (Signed)
Advise him to lower Toujeo to 50 units nightly, lower Humalog to 5 units 3 times a day with meals  for pre-meal BG readings of 90-150mg /dl, plus  correction for  hyperglycemia above 150mg /dl. He must stick to plan  strict monitoring of glucose 4 times a day-before meals and at bedtime.

## 2018-04-21 NOTE — Telephone Encounter (Signed)
Jeffery Beard is stating that his blood sugar is dropping really low at times but he also admitted to skipping meals on a daily basis  Sun 01/12 AM-264 Before lunch-245 Before dinner-68  Mon 01/13 Am-335 Before lunch-109 Before Dinner-68  Tues 01/14 Did not eat or test in the AM Before Dinner-193 Bedtime-122  Wed 01/15 Before lunch- 77 Before dinner -60  Thurs 01/16 Am-97 After breakfast 53 Has taken 3 glucose tabs and an hr later up to just 63

## 2018-05-06 ENCOUNTER — Ambulatory Visit: Payer: Managed Care, Other (non HMO) | Admitting: Cardiology

## 2018-05-06 ENCOUNTER — Encounter: Payer: Self-pay | Admitting: Cardiology

## 2018-05-06 VITALS — BP 138/98 | HR 76 | Ht 72.0 in | Wt 172.0 lb

## 2018-05-06 DIAGNOSIS — I350 Nonrheumatic aortic (valve) stenosis: Secondary | ICD-10-CM

## 2018-05-06 DIAGNOSIS — R002 Palpitations: Secondary | ICD-10-CM | POA: Diagnosis not present

## 2018-05-06 DIAGNOSIS — Z862 Personal history of diseases of the blood and blood-forming organs and certain disorders involving the immune mechanism: Secondary | ICD-10-CM | POA: Diagnosis not present

## 2018-05-06 DIAGNOSIS — Q231 Congenital insufficiency of aortic valve: Secondary | ICD-10-CM

## 2018-05-06 NOTE — Progress Notes (Signed)
Cardiology Office Note  Date: 05/06/2018   ID: Deni Lefever, DOB 07-Jul-1960, MRN [id number]  PCP: Celene Squibb, MD  Primary Cardiologist: Rozann Lesches, MD    Chief Complaint  Patient presents with  . Aortic valve disease    History of Present Illness: Jeffery Beard is a 58 y.o. male that I met in December 2019.  He is here with his wife for a follow-up visit.  Since initial evaluation he does not report any progressive sense of palpitations, these have actually settled down somewhat.  He has not had any exertional chest pain, no syncope.  Echocardiogram in December 2019 revealed LVEF 60 to 65% with grade 1 diastolic dysfunction, bicuspid aortic valve with moderate stenosis.  I discussed the results with him today.  He has apparently been aware of having a bicuspid aortic valve for many years although has not had regular imaging.  At this point we would anticipate at least a 1 year follow-up with repeat echocardiogram.  He did not obtain the monitor that we ordered at the last visit, apparently there was some difficulty getting it, it was never mailed to him.  He voiced some concerns about the cost and for now was comfortable holding off on further cardiac monitoring.  Past Medical History:  Diagnosis Date  . Atrial fibrillation ([place])    Details unclear - patient states diagnosed in his 45s  . Bicuspid aortic valve   . Hyperlipidemia   . Sarcoidosis   . Type 2 diabetes mellitus ([place])     Past Surgical History:  Procedure Laterality Date  . LUNG BIOPSY    . Nasal septum surgery    . PALATE / UVULA BIOPSY / EXCISION    . TONSILLECTOMY    . VASECTOMY      Current Outpatient Medications  Medication Sig Dispense Refill  . CHANTIX STARTING MONTH PAK 0.5 MG X 11 & 1 MG X 42 tablet     . Insulin Glargine, 1 Unit Dial, (TOUJEO SOLOSTAR) 300 UNIT/ML SOPN Inject 60 Units into the skin at bedtime. 4.5 mL 2  . Insulin Lispro (HUMALOG KWIKPEN) 200 UNIT/ML SOPN Inject 8-14  Units into the skin 3 (three) times daily before meals. 15 mL 2   No current facility-administered medications for this visit.    Allergies:  Codeine and Hydrocodone-acetaminophen   Social History: The patient  reports that he has been smoking cigarettes. He has a 25.00 pack-year smoking history. He has never used smokeless tobacco. He reports that he does not drink alcohol or use drugs.   ROS:  Please see the history of present illness. Otherwise, complete review of systems is positive for none.  All other systems are reviewed and negative.   Physical Exam: VS:  BP (!) 138/98   Pulse 76   Ht 6' (1.829 m)   Wt 172 lb (78 kg)   SpO2 97%   BMI 23.33 kg/m , BMI Body mass index is 23.33 kg/m.  Wt Readings from Last 3 Encounters:  05/06/18 172 lb (78 kg)  04/11/18 171 lb (77.6 kg)  03/23/18 162 lb (73.5 kg)    General: Patient appears comfortable at rest. HEENT: Conjunctiva and lids normal, oropharynx clear. Neck: Supple, no elevated JVP or carotid bruits, no thyromegaly. Lungs: Clear to auscultation, nonlabored breathing at rest. Cardiac: Regular rate and rhythm, no S3, 2/6 systolic murmur, no pericardial rub. Abdomen: Soft, nontender, bowel sounds present. Extremities: No pitting edema, distal pulses 2+. Skin: Warm and dry. Musculoskeletal: No  kyphosis. Neuropsychiatric: Alert and oriented x3, affect grossly appropriate.  ECG: I personally reviewed the tracing from 03/14/2018 which showed sinus rhythm with PAC.  Recent Labwork: 01/22/2018: BUN 15; Creatinine 1.1; TSH 5.00     Component Value Date/Time   CHOL 141 01/22/2018   TRIG 89 01/22/2018   HDL 49 01/22/2018   [place] 79 01/22/2018    Other Studies Reviewed Today:  Echocardiogram 03/17/2018: Study Conclusions  - Left ventricle: The cavity size was normal. Wall thickness was   increased in a pattern of mild LVH. Systolic function was normal.   The estimated ejection fraction was in the range of 60% to 65%.    Wall motion was normal; there were no regional wall motion   abnormalities. Doppler parameters are consistent with abnormal   left ventricular relaxation (grade 1 diastolic dysfunction). - Aortic valve: Moderately calcified annulus. Bicuspid; moderately   calcified leaflets. There was moderate stenosis. There was mild   regurgitation. Mean gradient (S): 13 mm Hg. Peak gradient (S): 21   mm Hg. Valve area (VTI): 1.25 cm^2. - Mitral valve: Mildly thickened leaflets. There was trivial   regurgitation. - Right atrium: Central venous pressure (est): 3 mm Hg. - Tricuspid valve: There was trivial regurgitation. - Pulmonary arteries: PA peak pressure: 21 mm Hg (S). - Pericardium, extracardiac: A prominent pericardial fat pad was   present.  Assessment and Plan:  1.  Bicuspid aortic valve with overall moderate aortic stenosis, mean gradient 13 mmHg.  Patient is not clearly symptomatic at this time, however warrants more regular evaluations.  We will plan at least a one-year clinical visit with repeat echocardiogram, sooner if symptoms escalate.  2.  Intermittent palpitations.  He reports a previous history of possible atrial fibrillation although I do not have any old records for confirmation.  He prefers to hold off on cardiac monitor at this time, I have asked him to let us know if symptoms worsen and he would like to reconsider.  3.  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  This also has not been followed regularly.  I again encouraged him to have his PCP refer him for further evaluation.  4.  Type 2 diabetes mellitus, followed by PCP.  Current medicines were reviewed with the patient today.   Orders Placed This Encounter  Procedures  . ECHOCARDIOGRAM COMPLETE    Disposition: Follow-up in 1 year with repeat echocardiogram.  Signed, Satira Sark, MD, [facility] 05/06/2018 4:51 PM    [place] [facility] [place], [place], [place]  [address] Phone: [phone]; Fax: [phone]

## 2018-05-06 NOTE — Patient Instructions (Addendum)
Medication Instructions:   Your physician recommends that you continue on your current medications as directed. Please refer to the Current Medication list given to you today.  Labwork:  NONE  Testing/Procedures: Your physician has requested that you have an echocardiogram in one year just before your next visit. Echocardiography is a painless test that uses sound waves to create images of your heart. It provides your doctor with information about the size and shape of your heart and how well your heart's chambers and valves are working. This procedure takes approximately one hour. There are no restrictions for this procedure.  Follow-Up:  Your physician recommends that you schedule a follow-up appointment in: 1 year. You will receive a reminder letter in the mail in about 10 months reminding you to call and schedule your appointment. If you don't receive this letter, please contact our office.  Any Other Special Instructions Will Be Listed Below (If Applicable).  If you need a refill on your cardiac medications before your next appointment, please call your pharmacy. 

## 2018-05-17 ENCOUNTER — Other Ambulatory Visit: Payer: Self-pay | Admitting: "Endocrinology

## 2018-05-18 ENCOUNTER — Other Ambulatory Visit: Payer: Self-pay

## 2018-05-18 MED ORDER — INSULIN GLARGINE (1 UNIT DIAL) 300 UNIT/ML ~~LOC~~ SOPN: 60.0000 [IU] | PEN_INJECTOR | Freq: Every day | SUBCUTANEOUS | 0 refills | Status: DC

## 2018-06-20 ENCOUNTER — Ambulatory Visit: Payer: Managed Care, Other (non HMO) | Admitting: "Endocrinology

## 2018-06-23 LAB — COMPREHENSIVE METABOLIC PANEL
ALBUMIN: 4.5 g/dL (ref 3.8–4.9)
ALT: 11 IU/L (ref 0–44)
AST: 13 IU/L (ref 0–40)
Albumin/Globulin Ratio: 2 (ref 1.2–2.2)
Alkaline Phosphatase: 53 IU/L (ref 39–117)
BUN/Creatinine Ratio: 9 (ref 9–20)
BUN: 9 mg/dL (ref 6–24)
Bilirubin Total: 0.5 mg/dL (ref 0.0–1.2)
CO2: 25 mmol/L (ref 20–29)
CREATININE: 0.97 mg/dL (ref 0.76–1.27)
Calcium: 9.4 mg/dL (ref 8.7–10.2)
Chloride: 104 mmol/L (ref 96–106)
GFR calc Af Amer: 100 mL/min/{1.73_m2} (ref >59)
GFR calc non Af Amer: 86 mL/min/{1.73_m2} (ref >59)
GLUCOSE: 113 mg/dL — AB (ref 65–99)
Globulin, Total: 2.3 g/dL (ref 1.5–4.5)
Potassium: 4.2 mmol/L (ref 3.5–5.2)
Sodium: 143 mmol/L (ref 134–144)
Total Protein: 6.8 g/dL (ref 6.0–8.5)

## 2018-06-23 LAB — HEMOGLOBIN A1C
Est. average glucose Bld gHb Est-mCnc: 263 mg/dL
Hgb A1c MFr Bld: 10.8 % — ABNORMAL HIGH (ref 4.8–5.6)

## 2018-06-23 LAB — GLUTAMIC ACID DECARBOXYLASE AUTO ABS: Glutamic Acid Decarb Ab: 15.3 U/mL — ABNORMAL HIGH (ref 0.0–5.0)

## 2018-06-23 LAB — ANTI-ISLET CELL ANTIBODY: Islet Cell Ab: NEGATIVE

## 2018-07-19 ENCOUNTER — Encounter: Payer: Self-pay | Admitting: "Endocrinology

## 2018-07-20 ENCOUNTER — Other Ambulatory Visit: Payer: Self-pay

## 2018-07-20 ENCOUNTER — Ambulatory Visit (INDEPENDENT_AMBULATORY_CARE_PROVIDER_SITE_OTHER): Payer: Managed Care, Other (non HMO) | Admitting: "Endocrinology

## 2018-07-20 ENCOUNTER — Encounter: Payer: Self-pay | Admitting: "Endocrinology

## 2018-07-20 DIAGNOSIS — E782 Mixed hyperlipidemia: Secondary | ICD-10-CM

## 2018-07-20 DIAGNOSIS — E1065 Type 1 diabetes mellitus with hyperglycemia: Secondary | ICD-10-CM

## 2018-07-20 DIAGNOSIS — E139 Other specified diabetes mellitus without complications: Secondary | ICD-10-CM

## 2018-07-20 DIAGNOSIS — E02 Subclinical iodine-deficiency hypothyroidism: Secondary | ICD-10-CM

## 2018-07-20 MED ORDER — INSULIN GLARGINE (1 UNIT DIAL) 300 UNIT/ML ~~LOC~~ SOPN: 60.0000 [IU] | PEN_INJECTOR | Freq: Every day | SUBCUTANEOUS | 0 refills | Status: DC

## 2018-07-20 MED ORDER — INSULIN LISPRO 200 UNIT/ML ~~LOC~~ SOPN: 5.0000 [IU] | PEN_INJECTOR | Freq: Three times a day (TID) | SUBCUTANEOUS | 5 refills | Status: DC

## 2018-07-20 NOTE — Progress Notes (Addendum)
07/20/2018, 2:46 PM        Endocrinology Telehealth Visit Follow up Note -During COVID -19 Pandemic  Patient has verbally consented to this visit, and please refer to the patient's chart for details of arrangement for this telehealth for [facility] .  Subjective:    Patient ID: Jeffery Beard, male    DOB: [birth year].  Lucky Trotta is being engaged in telephone visit for follow-up of the  management of currently uncontrolled symptomatic diabetes requested by  Benita Stabile, MD.   Past Medical History:  Diagnosis Date  . Atrial fibrillation (HCC)    Details unclear - patient states diagnosed in his 40s  . Bicuspid aortic valve   . Hyperlipidemia   . Sarcoidosis   . Type 2 diabetes mellitus (HCC)    Past Surgical History:  Procedure Laterality Date  . LUNG BIOPSY    . Nasal septum surgery    . PALATE / UVULA BIOPSY / EXCISION    . TONSILLECTOMY    . VASECTOMY     Social History   Socioeconomic History  . Marital status: Married    Spouse name: Not on file  . Number of children: Not on file  . Years of education: Not on file  . Highest education level: Not on file  Occupational History  . Not on file  Social Needs  . Financial resource strain: Not on file  . Food insecurity:    Worry: Not on file    Inability: Not on file  . Transportation needs:    Medical: Not on file    Non-medical: Not on file  Tobacco Use  . Smoking status: Current Every Day Smoker    Packs/day: 1.00    Years: 25.00    Pack years: 25.00    Types: Cigarettes  . Smokeless tobacco: Never Used  Substance and Sexual Activity  . Alcohol use: Never    Frequency: Never  . Drug use: Never  . Sexual activity: Not on file  Lifestyle  . Physical activity:    Days per week: Not on file    Minutes per session: Not on file  . Stress: Not on file  Relationships  .  Social connections:    Talks on phone: Not on file    Gets together: Not on file    Attends religious service: Not on file    Active member of club or organization: Not on file    Attends meetings of clubs or organizations: Not on file    Relationship status: Not on file  Other Topics Concern  . Not on file  Social History Narrative  . Not on file   Outpatient Encounter Medications as of 07/20/2018  Medication Sig  . CHANTIX STARTING MONTH PAK 0.5 MG X 11 & 1 MG X 42 tablet   . Insulin Glargine, 1  Unit Dial, (TOUJEO SOLOSTAR) 300 UNIT/ML SOPN Inject 60 Units into the skin at bedtime.  . Insulin Lispro (HUMALOG KWIKPEN) 200 UNIT/ML SOPN Inject 5-14 Units into the skin 3 (three) times daily before meals.  . [DISCONTINUED] Insulin Glargine, 1 Unit Dial, (TOUJEO SOLOSTAR) 300 UNIT/ML SOPN Inject 60 Units into the skin at bedtime. (Patient taking differently: Inject 50 Units into the skin at bedtime. )  . [DISCONTINUED] Insulin Lispro (HUMALOG KWIKPEN) 200 UNIT/ML SOPN Inject 8-14 Units into the skin 3 (three) times daily before meals. (Patient taking differently: Inject 5-14 Units into the skin 3 (three) times daily before meals. )   No facility-administered encounter medications on file as of 07/20/2018.     ALLERGIES: Allergies  Allergen Reactions  . Codeine   . Hydrocodone-Acetaminophen     Other reaction(s): Other (See Comments) Nervousness and itching    VACCINATION STATUS:  There is no immunization history on file for this patient.  Diabetes  He presents for his follow-up diabetic visit. 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 Onset time: He was diagnosed at approximate age of 50 years. His disease course has been improving. There are no hypoglycemic associated symptoms. Pertinent negatives for hypoglycemia include no confusion, headaches, pallor or seizures. Pertinent negatives for diabetes include  no chest pain, no fatigue, no polydipsia, no polyphagia, no polyuria and no weakness. There are no hypoglycemic complications. Symptoms are improving. Risk factors for coronary artery disease include diabetes mellitus, dyslipidemia, male sex, tobacco exposure and sedentary lifestyle. Current diabetic treatment includes insulin injections (He is currently on Toujeo 40 units twice daily, Humalog 15 units 3 times daily AC.). He is following a generally unhealthy diet. When asked about meal planning, he reported none. He has not had a previous visit with a dietitian. He rarely participates in exercise. His home blood glucose trend is fluctuating minimally. His overall blood glucose range is >200 mg/dl. An ACE inhibitor/angiotensin II receptor blocker is not being taken.  Hyperlipidemia  This is a chronic problem. The current episode started more than 1 year ago. The problem is controlled. Exacerbating diseases include diabetes. Pertinent negatives include no chest pain, myalgias or shortness of breath. Risk factors for coronary artery disease include diabetes mellitus, dyslipidemia, male sex and a sedentary lifestyle.     Objective:    There were no vitals taken for this visit.  Wt Readings from Last 3 Encounters:  05/06/18 172 lb (78 kg)  04/11/18 171 lb (77.6 kg)  03/23/18 162 lb (73.5 kg)     Recent Results (from the past 2160 hour(s))  Hemoglobin A1c     Status: Abnormal   Collection Time: 06/20/18  4:26 PM  Result Value Ref Range   Hgb A1c MFr Bld 10.8 (H) 4.8 - 5.6 %    Comment:          Prediabetes: 5.7 - 6.4          Diabetes: >6.4          Glycemic control for adults with diabetes: <7.0    Est. average glucose Bld gHb Est-mCnc 263 mg/dL  Anti-islet cell antibody     Status: None   Collection Time: 06/20/18  4:26 PM  Result Value Ref Range   Islet Cell Ab Negative Neg:<1:1  Glutamic acid decarboxylase auto abs     Status: Abnormal   Collection Time: 06/20/18  4:26 PM  Result Value  Ref Range  Glutamic Acid Decarb Ab 15.3 (H) 0.0 - 5.0 U/mL  Comprehensive metabolic panel     Status: Abnormal   Collection Time: 06/20/18  4:26 PM  Result Value Ref Range   Glucose 113 (H) 65 - 99 mg/dL   BUN 9 6 - 24 mg/dL   Creatinine, Ser 1.61 0.76 - 1.27 mg/dL   GFR calc non Af Amer 86 >59 mL/min/1.73   GFR calc Af Amer 100 >59 mL/min/1.73   BUN/Creatinine Ratio 9 9 - 20   Sodium 143 134 - 144 mmol/L   Potassium 4.2 3.5 - 5.2 mmol/L   Chloride 104 96 - 106 mmol/L   CO2 25 20 - 29 mmol/L   Calcium 9.4 8.7 - 10.2 mg/dL   Total Protein 6.8 6.0 - 8.5 g/dL   Albumin 4.5 3.8 - 4.9 g/dL   Globulin, Total 2.3 1.5 - 4.5 g/dL   Albumin/Globulin Ratio 2.0 1.2 - 2.2   Bilirubin Total 0.5 0.0 - 1.2 mg/dL   Alkaline Phosphatase 53 39 - 117 IU/L   AST 13 0 - 40 IU/L   ALT 11 0 - 44 IU/L     Assessment & Plan:   1. Uncontrolled type 2 diabetes mellitus with hyperglycemia (HCC)  - Stockton Ducre has currently uncontrolled symptomatic diabetes since 58 years of age.  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   -He reports improvement in his symptoms, A1c still high at 10.8% although improved from 14.4%.      Patient denies prior history of diabetes ketoacidosis.  Patient has history of alcohol use/abuse.  His maximum body weight has been 210 pounds with subsequent significant unintentional weight loss down to 147.  Recently regaining to 162.  Recent labs reviewed.   -his diabetes is complicated by heavy smoking and he remains at a high risk for more acute and chronic complications which include CAD, CVA, CKD, retinopathy, and neuropathy. These are all discussed in detail with him.  - I have counseled him on diet management by adopting a carbohydrate restricted/protein rich diet.  Has gained 9 pounds since last visit, which is a good development for him.  -  Suggestion is made for him to avoid simple  carbohydrates  from his diet including Cakes, Sweet Desserts / Pastries, Ice Cream, Soda (diet and regular), Sweet Tea, Candies, Chips, Cookies, Store Bought Juices, Alcohol in Excess of  1-2 drinks a day, Artificial Sweeteners, and "Sugar-free" Products. This will help patient to have stable blood glucose profile and potentially avoid unintended weight gain.   - I encouraged him to switch to  unprocessed or minimally processed complex starch and increased protein intake (animal or plant source), fruits, and vegetables.  - he is advised to stick to a routine mealtimes to eat 3 meals  a day and avoid unnecessary snacks ( to snack only to correct hypoglycemia).   - he has been  scheduled with Norm Salt, RDN, CDE for individualized diabetes education.  Weight loss is not advisable for him.  - I have approached him with the following individualized plan to manage diabetes and patient agrees:   -He will continue to require intensive treatment with basal/bolus insulin in order for him to achieve and maintain control of type 1 diabetes going forward. -He is advised to increase his Toujeo to 60 units nightly, lower Humalog to 5  units 3 times a day with meals  for pre-meal BG readings  of 90-150mg /dl, plus patient specific correction dose for unexpected hyperglycemia above 150mg /dl, associated with strict monitoring of glucose 4 times a day-before meals and at bedtime. - he is warned not to take insulin without proper monitoring per orders. -Reportedly, he developed severe skin reaction when he applied his 14 days CGM device, and would like to avoid it for now. - Adjustment parameters are given to him for hypo and hyperglycemia in writing. - he is encouraged to call clinic for blood glucose levels less than 70 or above 300 mg /dl.  2) Lipids/Hyperlipidemia:   Review of his recent lipid panel showed controlled  LDL at 74.  he  is not on statins.  He will be considered for low-dose statin therapy during  subsequent visits.     3) subclinical hypothyroidism -He is currently not on any antithyroid agents nor thyroid hormone supplements.  He will require repeat full profile thyroid function tests on subsequent visits.  This patient also has history of heavy steroid exposure related to his psoriatic arthritis.  This puts him at risk for adrenal insufficiency, will need studies to ensure adrenal sufficiency.  - he is  advised to maintain close follow up with Benita StabileHall, John Z, MD for primary care needs, as well as his other providers for optimal and coordinated care.  - Patient Care Time Today:  25 min, of which >50% was spent in reviewing his  current and  previous labs/studies, his glycemic profile, previous treatments, and medications doses and developing a plan for long-term care based on the latest recommendations for standards of care.  Milas KocherPhillip Caiazzo participated in the discussions, expressed understanding, and voiced agreement with the above plans.  All questions were answered to his satisfaction. he is encouraged to contact clinic should he have any questions or concerns prior to his return visit.   Follow up plan: - Return in about 3 months (around 10/19/2018) for Follow up with Pre-visit Labs, Meter, and Logs.  Marquis LunchGebre Janmichael Giraud, MD [facility] [address] [place], [place] [address] Phone: [phone]  Fax: [phone]    07/20/2018, 2:46 PM  This note was partially dictated with voice recognition software. Similar sounding words can be transcribed inadequately or may not  be corrected upon review.

## 2018-08-14 ENCOUNTER — Encounter (HOSPITAL_COMMUNITY): Payer: Self-pay | Admitting: Emergency Medicine

## 2018-08-14 ENCOUNTER — Emergency Department (HOSPITAL_COMMUNITY): Payer: Managed Care, Other (non HMO)

## 2018-08-14 ENCOUNTER — Inpatient Hospital Stay (HOSPITAL_COMMUNITY)
Admission: EM | Admit: 2018-08-14 | Discharge: 2018-08-16 | DRG: 309 | Disposition: A | Discharge status: Home / Self Care | Payer: Managed Care, Other (non HMO) | Attending: Internal Medicine | Admitting: Internal Medicine

## 2018-08-14 ENCOUNTER — Inpatient Hospital Stay (HOSPITAL_COMMUNITY): Payer: Managed Care, Other (non HMO)

## 2018-08-14 ENCOUNTER — Other Ambulatory Visit: Payer: Self-pay

## 2018-08-14 DIAGNOSIS — Z825 Family history of asthma and other chronic lower respiratory diseases: Secondary | ICD-10-CM | POA: Diagnosis not present

## 2018-08-14 DIAGNOSIS — I5032 Chronic diastolic (congestive) heart failure: Secondary | ICD-10-CM | POA: Diagnosis present

## 2018-08-14 DIAGNOSIS — I712 Thoracic aortic aneurysm, without rupture, unspecified: Secondary | ICD-10-CM

## 2018-08-14 DIAGNOSIS — Z79899 Other long term (current) drug therapy: Secondary | ICD-10-CM | POA: Diagnosis not present

## 2018-08-14 DIAGNOSIS — I351 Nonrheumatic aortic (valve) insufficiency: Secondary | ICD-10-CM | POA: Diagnosis not present

## 2018-08-14 DIAGNOSIS — D869 Sarcoidosis, unspecified: Secondary | ICD-10-CM

## 2018-08-14 DIAGNOSIS — I4891 Unspecified atrial fibrillation: Secondary | ICD-10-CM

## 2018-08-14 DIAGNOSIS — D860 Sarcoidosis of lung: Secondary | ICD-10-CM | POA: Diagnosis not present

## 2018-08-14 DIAGNOSIS — E785 Hyperlipidemia, unspecified: Secondary | ICD-10-CM | POA: Diagnosis present

## 2018-08-14 DIAGNOSIS — Z794 Long term (current) use of insulin: Secondary | ICD-10-CM | POA: Diagnosis not present

## 2018-08-14 DIAGNOSIS — Z87891 Personal history of nicotine dependence: Secondary | ICD-10-CM | POA: Diagnosis not present

## 2018-08-14 DIAGNOSIS — I10 Essential (primary) hypertension: Secondary | ICD-10-CM

## 2018-08-14 DIAGNOSIS — E119 Type 2 diabetes mellitus without complications: Secondary | ICD-10-CM | POA: Diagnosis not present

## 2018-08-14 DIAGNOSIS — Z1159 Encounter for screening for other viral diseases: Secondary | ICD-10-CM

## 2018-08-14 DIAGNOSIS — Z8673 Personal history of transient ischemic attack (TIA), and cerebral infarction without residual deficits: Secondary | ICD-10-CM | POA: Diagnosis not present

## 2018-08-14 DIAGNOSIS — Z7189 Other specified counseling: Secondary | ICD-10-CM | POA: Diagnosis not present

## 2018-08-14 DIAGNOSIS — E876 Hypokalemia: Secondary | ICD-10-CM | POA: Diagnosis present

## 2018-08-14 DIAGNOSIS — I480 Paroxysmal atrial fibrillation: Principal | ICD-10-CM | POA: Diagnosis present

## 2018-08-14 DIAGNOSIS — I110 Hypertensive heart disease with heart failure: Secondary | ICD-10-CM | POA: Diagnosis present

## 2018-08-14 DIAGNOSIS — Q231 Congenital insufficiency of aortic valve: Secondary | ICD-10-CM

## 2018-08-14 DIAGNOSIS — I340 Nonrheumatic mitral (valve) insufficiency: Secondary | ICD-10-CM | POA: Diagnosis not present

## 2018-08-14 DIAGNOSIS — I350 Nonrheumatic aortic (valve) stenosis: Secondary | ICD-10-CM | POA: Diagnosis present

## 2018-08-14 DIAGNOSIS — E1165 Type 2 diabetes mellitus with hyperglycemia: Secondary | ICD-10-CM | POA: Diagnosis present

## 2018-08-14 LAB — CBC WITH DIFFERENTIAL/PLATELET
Abs Immature Granulocytes: 0.02 10*3/uL (ref 0.00–0.07)
Basophils Absolute: 0.1 10*3/uL (ref 0.0–0.1)
Basophils Relative: 1 %
Eosinophils Absolute: 0.1 10*3/uL (ref 0.0–0.5)
Eosinophils Relative: 1 %
HCT: 48.5 % (ref 39.0–52.0)
Hemoglobin: 16.8 g/dL (ref 13.0–17.0)
Immature Granulocytes: 0 %
Lymphocytes Relative: 16 %
Lymphs Abs: 1.8 10*3/uL (ref 0.7–4.0)
MCH: 30.7 pg (ref 26.0–34.0)
MCHC: 34.6 g/dL (ref 30.0–36.0)
MCV: 88.7 fL (ref 80.0–100.0)
Monocytes Absolute: 0.7 10*3/uL (ref 0.1–1.0)
Monocytes Relative: 7 %
Neutro Abs: 8.1 10*3/uL — ABNORMAL HIGH (ref 1.7–7.7)
Neutrophils Relative %: 75 %
Platelets: 197 10*3/uL (ref 150–400)
RBC: 5.47 MIL/uL (ref 4.22–5.81)
RDW: 12.1 % (ref 11.5–15.5)
WBC: 10.8 10*3/uL — ABNORMAL HIGH (ref 4.0–10.5)
nRBC: 0 % (ref 0.0–0.2)

## 2018-08-14 LAB — BASIC METABOLIC PANEL
Anion gap: 12 (ref 5–15)
BUN: 20 mg/dL (ref 6–20)
CO2: 25 mmol/L (ref 22–32)
Calcium: 9.1 mg/dL (ref 8.9–10.3)
Chloride: 102 mmol/L (ref 98–111)
Creatinine, Ser: 0.84 mg/dL (ref 0.61–1.24)
GFR calc Af Amer: >60 mL/min (ref >60)
GFR calc non Af Amer: >60 mL/min (ref >60)
Glucose, Bld: 207 mg/dL — ABNORMAL HIGH (ref 70–99)
Potassium: 3.6 mmol/L (ref 3.5–5.1)
Sodium: 139 mmol/L (ref 135–145)

## 2018-08-14 LAB — GLUCOSE, CAPILLARY
Glucose-Capillary: 250 mg/dL — ABNORMAL HIGH (ref 70–99)
Glucose-Capillary: 184 mg/dL — ABNORMAL HIGH (ref 70–99)

## 2018-08-14 LAB — MRSA PCR SCREENING: MRSA by PCR: NEGATIVE

## 2018-08-14 LAB — PROTIME-INR
INR: 1 (ref 0.8–1.2)
Prothrombin Time: 13.5 s (ref 11.4–15.2)

## 2018-08-14 LAB — TROPONIN I
Troponin I: <0.03 ng/mL (ref <0.03)
Troponin I: <0.03 ng/mL (ref <0.03)
Troponin I: <0.03 ng/mL

## 2018-08-14 LAB — SARS CORONAVIRUS 2 BY RT PCR (HOSPITAL ORDER, PERFORMED IN ~~LOC~~ HOSPITAL LAB): SARS Coronavirus 2: NEGATIVE

## 2018-08-14 LAB — TSH: TSH: 8.147 u[IU]/mL — ABNORMAL HIGH (ref 0.350–4.500)

## 2018-08-14 LAB — APTT: aPTT: 29 s (ref 24–36)

## 2018-08-14 LAB — HEPARIN LEVEL (UNFRACTIONATED): Heparin Unfractionated: 0.73 [IU]/mL — ABNORMAL HIGH (ref 0.30–0.70)

## 2018-08-14 LAB — CBG MONITORING, ED: Glucose-Capillary: 199 mg/dL — ABNORMAL HIGH (ref 70–99)

## 2018-08-14 MED ORDER — ACETAMINOPHEN 325 MG PO TABS: 650.0000 mg | ORAL_TABLET | Freq: Four times a day (QID) | ORAL | Status: DC | PRN

## 2018-08-14 MED ORDER — METOPROLOL TARTRATE 25 MG PO TABS: 25.0000 mg | ORAL_TABLET | Freq: Two times a day (BID) | ORAL | Status: DC

## 2018-08-14 MED ORDER — INSULIN GLARGINE 100 UNIT/ML ~~LOC~~ SOLN
48.0000 [IU] | Freq: Every day | SUBCUTANEOUS | Status: DC
  Filled 2018-08-14: qty 0.48

## 2018-08-14 MED ORDER — DILTIAZEM LOAD VIA INFUSION
10.0000 mg | Freq: Once | INTRAVENOUS | Status: AC
  Administered 2018-08-14: 10 mg via INTRAVENOUS
  Filled 2018-08-14: qty 10

## 2018-08-14 MED ORDER — HEPARIN BOLUS VIA INFUSION
4500.0000 [IU] | Freq: Once | INTRAVENOUS | Status: AC
  Administered 2018-08-14: 4500 [IU] via INTRAVENOUS

## 2018-08-14 MED ORDER — NON FORMULARY: 60.0000 [IU] | Freq: Every day | Status: DC

## 2018-08-14 MED ORDER — NON FORMULARY: 5.0000 [IU] | Freq: Three times a day (TID) | Status: DC

## 2018-08-14 MED ORDER — INSULIN GLARGINE (1 UNIT DIAL) 300 UNIT/ML ~~LOC~~ SOPN
60.0000 [IU] | PEN_INJECTOR | Freq: Every day | SUBCUTANEOUS | Status: DC
  Administered 2018-08-14 – 2018-08-15 (×2): 60 [IU] via SUBCUTANEOUS

## 2018-08-14 MED ORDER — SODIUM CHLORIDE 0.9% FLUSH: 3.0000 mL | INTRAVENOUS | Status: DC | PRN

## 2018-08-14 MED ORDER — ADENOSINE 6 MG/2ML IV SOLN
6.0000 mg | Freq: Once | INTRAVENOUS | Status: AC
  Administered 2018-08-14: 6 mg via INTRAVENOUS
  Filled 2018-08-14: qty 2

## 2018-08-14 MED ORDER — DILTIAZEM HCL 25 MG/5ML IV SOLN
INTRAVENOUS | Status: AC
  Filled 2018-08-14: qty 5

## 2018-08-14 MED ORDER — ONDANSETRON HCL 4 MG/2ML IJ SOLN: 4.0000 mg | Freq: Four times a day (QID) | INTRAMUSCULAR | Status: DC | PRN

## 2018-08-14 MED ORDER — METOPROLOL TARTRATE 5 MG/5ML IV SOLN
5.0000 mg | Freq: Once | INTRAVENOUS | Status: AC
  Administered 2018-08-14: 5 mg via INTRAVENOUS
  Filled 2018-08-14: qty 5

## 2018-08-14 MED ORDER — VARENICLINE TARTRATE 1 MG PO TABS
1.0000 mg | ORAL_TABLET | Freq: Two times a day (BID) | ORAL | Status: DC
  Administered 2018-08-14 – 2018-08-16 (×4): 1 mg via ORAL
  Filled 2018-08-14 (×6): qty 1

## 2018-08-14 MED ORDER — SODIUM CHLORIDE 0.9 % IV SOLN
250.0000 mL | INTRAVENOUS | Status: DC | PRN
  Administered 2018-08-14: 15:00:00 10 mL via INTRAVENOUS

## 2018-08-14 MED ORDER — DILTIAZEM HCL 100 MG IV SOLR
5.0000 mg/h | INTRAVENOUS | Status: DC
  Administered 2018-08-14 (×2): 5 mg/h via INTRAVENOUS
  Filled 2018-08-14: qty 100

## 2018-08-14 MED ORDER — HEPARIN (PORCINE) 25000 UT/250ML-% IV SOLN
1000.0000 [IU]/h | INTRAVENOUS | Status: DC
  Administered 2018-08-14: 1100 [IU]/h via INTRAVENOUS
  Administered 2018-08-15: 1000 [IU]/h via INTRAVENOUS
  Filled 2018-08-14 (×2): qty 250

## 2018-08-14 MED ORDER — ONDANSETRON HCL 4 MG PO TABS: 4.0000 mg | ORAL_TABLET | Freq: Four times a day (QID) | ORAL | Status: DC | PRN

## 2018-08-14 MED ORDER — DILTIAZEM HCL 100 MG IV SOLR
INTRAVENOUS | Status: AC
  Filled 2018-08-14: qty 100

## 2018-08-14 MED ORDER — INSULIN ASPART 100 UNIT/ML ~~LOC~~ SOLN: 5.0000 [IU] | Freq: Three times a day (TID) | SUBCUTANEOUS | Status: DC

## 2018-08-14 MED ORDER — INSULIN LISPRO (1 UNIT DIAL) 100 UNIT/ML (KWIKPEN)
5.0000 [IU] | PEN_INJECTOR | Freq: Three times a day (TID) | SUBCUTANEOUS | Status: DC
  Administered 2018-08-14: 8 [IU] via SUBCUTANEOUS
  Administered 2018-08-15 (×2): 5 [IU] via SUBCUTANEOUS
  Administered 2018-08-16: 7 [IU] via SUBCUTANEOUS

## 2018-08-14 MED ORDER — ACETAMINOPHEN 650 MG RE SUPP: 650.0000 mg | Freq: Four times a day (QID) | RECTAL | Status: DC | PRN

## 2018-08-14 MED ORDER — METOPROLOL TARTRATE 25 MG PO TABS
25.0000 mg | ORAL_TABLET | Freq: Two times a day (BID) | ORAL | Status: DC
  Administered 2018-08-14: 25 mg via ORAL
  Filled 2018-08-14 (×2): qty 1

## 2018-08-14 MED ORDER — INSULIN ASPART 100 UNIT/ML ~~LOC~~ SOLN: 0.0000 [IU] | Freq: Every day | SUBCUTANEOUS | Status: DC

## 2018-08-14 MED ORDER — INSULIN ASPART 100 UNIT/ML ~~LOC~~ SOLN: 0.0000 [IU] | Freq: Three times a day (TID) | SUBCUTANEOUS | Status: DC

## 2018-08-14 MED ORDER — SODIUM CHLORIDE 0.9% FLUSH
3.0000 mL | Freq: Two times a day (BID) | INTRAVENOUS | Status: DC
  Administered 2018-08-15: 3 mL via INTRAVENOUS

## 2018-08-14 MED ORDER — DILTIAZEM HCL 25 MG/5ML IV SOLN: 10.0000 mg | Freq: Once | INTRAVENOUS | Status: DC

## 2018-08-14 NOTE — ED Provider Notes (Signed)
[facility] EMERGENCY DEPARTMENT Provider Note   CSN: [id number] Arrival date & time: 08/14/18  1013    History   Chief Complaint Chief Complaint  Patient presents with  . Chest Pain    HPI Jeffery Beard is a 58 y.o. male with a distant history of paroxysmal afib, no medications for this as pt states medications never improved his sx, bicuspid aortic valve, sarcoidosis and diabetes woke around 5 am today with palpitations along with sob and weakness.  He denies chest "pain" but reports a percussion type sensation localizing to left chest.  He has had no n/v or abd pain, no dizziness or lightheadedness.  He reports having excessive fatigue when getting some propane tanks filled this am (reports he had to run some errands before could come here). He denies any new medications, he does report chronic daily heavy caffeine intake, but probably less intake over the past 24 hours then his normal amount.  He has found no alleviators for his symptoms.      The history is provided by the patient.    Past Medical History:  Diagnosis Date  . Atrial fibrillation (HCC)    Details unclear - patient states diagnosed in his 66s  . Bicuspid aortic valve   . Hyperlipidemia   . Sarcoidosis   . Type 2 diabetes mellitus [facility])     Patient Active Problem List   Diagnosis Date Noted  . Atrial fibrillation with RVR (HCC) 08/14/2018  . Mixed hyperlipidemia 03/24/2018  . Uncontrolled type 2 diabetes mellitus with hyperglycemia (HCC) 03/24/2018  . Current smoker 03/24/2018    Past Surgical History:  Procedure Laterality Date  . LUNG BIOPSY    . Nasal septum surgery    . PALATE / UVULA BIOPSY / EXCISION    . TONSILLECTOMY    . VASECTOMY          Home Medications    Prior to Admission medications   Medication Sig Start Date End Date Taking? Authorizing Provider  acetaminophen (TYLENOL) 650 MG CR tablet Take 650 mg by mouth every 8 (eight) hours as needed for pain.   Yes [provider]  CHANTIX 1 MG tablet Take 1 tablet by mouth 2 (two) times a day. 06/22/18  Yes [provider]  ibuprofen (ADVIL) 200 MG tablet Take 400-600 mg by mouth every 6 (six) hours as needed.   Yes [provider]  Insulin Glargine, 1 Unit Dial, (TOUJEO SOLOSTAR) 300 UNIT/ML SOPN Inject 60 Units into the skin at bedtime. 07/20/18  Yes Nida, Denman George, MD  Insulin Lispro (HUMALOG KWIKPEN) 200 UNIT/ML SOPN Inject 5-14 Units into the skin 3 (three) times daily before meals. 07/20/18  Yes Roma Kayser, MD    Family History Family History  Problem Relation Age of Onset  . COPD Mother   . High blood pressure Mother   . Cancer - Prostate Father   . High blood pressure Father     Social History Social History   Tobacco Use  . Smoking status: Current Every Day Smoker    Packs/day: 1.00    Years: 25.00    Pack years: 25.00    Types: Cigarettes  . Smokeless tobacco: Never Used  Substance Use Topics  . Alcohol use: Never    Frequency: Never  . Drug use: Never     Allergies   Codeine and Hydrocodone-acetaminophen   Review of Systems Review of Systems  Constitutional: Negative for fever.  HENT: Negative for congestion and sore  throat.   Eyes: Negative.   Respiratory: Positive for shortness of breath. Negative for chest tightness.   Cardiovascular: Positive for palpitations. Negative for chest pain.  Gastrointestinal: Negative for abdominal pain, nausea and vomiting.  Genitourinary: Negative.   Musculoskeletal: Negative for arthralgias, joint swelling and neck pain.  Skin: Negative.  Negative for rash and wound.  Neurological: Positive for weakness. Negative for dizziness, light-headedness, numbness and headaches.  Psychiatric/Behavioral: Negative.      Physical Exam Updated Vital Signs BP 113/74   Pulse (!) 56   Temp 98.6 F (37 C) (Oral)   Resp (!) 33   Ht 6' (1.829 m)   Wt 76.7 kg   SpO2 98%   BMI 22.92 kg/m   Physical Exam  Vitals signs and nursing note reviewed.  Constitutional:      Appearance: He is well-developed.  HENT:     Head: Normocephalic and atraumatic.  Eyes:     Conjunctiva/sclera: Conjunctivae normal.  Neck:     Musculoskeletal: Normal range of motion.  Cardiovascular:     Rate and Rhythm: Regular rhythm. Tachycardia present.     Pulses:          Carotid pulses are 2+ on the right side and 2+ on the left side.      Radial pulses are 2+ on the right side and 2+ on the left side.       Dorsalis pedis pulses are 2+ on the right side and 2+ on the left side.     Heart sounds: Normal heart sounds.  Pulmonary:     Effort: Pulmonary effort is normal.     Breath sounds: Normal breath sounds. No wheezing or rhonchi.  Abdominal:     General: Bowel sounds are normal.     Palpations: Abdomen is soft.     Tenderness: There is no abdominal tenderness.  Musculoskeletal: Normal range of motion.  Skin:    General: Skin is warm and dry.  Neurological:     Mental Status: He is alert.      ED Treatments / Results  Labs (all labs ordered are listed, but only abnormal results are displayed) Labs Reviewed  CBC WITH DIFFERENTIAL/PLATELET - Abnormal; Notable for the following components:      Result Value   WBC 10.8 (*)    Neutro Abs 8.1 (*)    All other components within normal limits  BASIC METABOLIC PANEL - Abnormal; Notable for the following components:   Glucose, Bld 207 (*)    All other components within normal limits  CBG MONITORING, ED - Abnormal; Notable for the following components:   Glucose-Capillary 199 (*)    All other components within normal limits  SARS CORONAVIRUS 2 (HOSPITAL ORDER, PERFORMED IN [facility])  TROPONIN I  CBG MONITORING, ED    EKG None  Radiology Dg Chest Portable 1 View  Result Date: 08/14/2018 CLINICAL DATA:  Chest pain, shortness of breath. EXAM: PORTABLE CHEST 1 VIEW COMPARISON:  None. FINDINGS: The heart size and mediastinal contours are  within normal limits. No pneumothorax or pleural effusion is noted. Left lung is clear. Right upper lobe nodular density is noted. The visualized skeletal structures are unremarkable. IMPRESSION: Right upper lobe nodular density is noted concerning for pulmonary nodule or mass. CT scan of the chest is recommended for further evaluation. Electronically Signed   By: Lupita Raider M.D.   On: 08/14/2018 11:48    Procedures Procedures (including critical care time)  Medications Ordered in  ED Medications  diltiazem (CARDIZEM) 1 mg/mL load via infusion 10 mg (10 mg Intravenous Bolus from Bag 08/14/18 1110)    And  diltiazem (CARDIZEM) 100 mg in dextrose 5 % 100 mL (1 mg/mL) infusion (5 mg/hr Intravenous Rate/Dose Verify 08/14/18 1152)  adenosine (ADENOCARD) 6 MG/2ML injection 6 mg (6 mg Intravenous Given 08/14/18 1102)     Initial Impression / Assessment and Plan / ED Course  I have reviewed the triage vital signs and the nursing notes.  Pertinent labs & imaging results that were available during my care of the patient were reviewed by me and considered in my medical decision making (see chart for details).        Pt with ekg suggesting SVT, given dose of adenosine with transient improvement in rate with underlying afib appreciated.  He was started on cardizem bolus and drip. Rates responding at this time, 115-125 range.  Will require admission for further eval/management.  Sx free at this time.  Discussed with Dr. Sherryll BurgerShah who agrees with admission.  Pt was seen also by Dr. Deretha EmoryZackowski during this visit.  CRITICAL CARE Performed by: Burgess AmorJulie Ionia Schey Total critical care time: 30 minutes Critical care time was exclusive of separately billable procedures and treating other patients. Critical care was necessary to treat or prevent imminent or life-threatening deterioration. Critical care was time spent personally by me on the following activities: development of treatment plan with patient and/or  surrogate as well as nursing, discussions with consultants, evaluation of patient's response to treatment, examination of patient, obtaining history from patient or surrogate, ordering and performing treatments and interventions, ordering and review of laboratory studies, ordering and review of radiographic studies, pulse oximetry and re-evaluation of patient's condition.   Final Clinical Impressions(s) / ED Diagnoses   Final diagnoses:  Atrial fibrillation with rapid ventricular response [facility](HCC)    ED Discharge Orders    None       Victoriano Laindol, Coron Rossano, PA-C 08/14/18 1211    Vanetta MuldersZackowski, Scott, MD 08/14/18 1326

## 2018-08-14 NOTE — ED Notes (Signed)
ED Provider at bedside. 

## 2018-08-14 NOTE — Progress Notes (Signed)
ANTICOAGULATION CONSULT NOTE - Initial Consult  Pharmacy Consult for heparin Indication: atrial fibrillation  Allergies  Allergen Reactions  . Codeine   . Hydrocodone-Acetaminophen     Other reaction(s): Other (See Comments) Nervousness and itching    Patient Measurements: Height: 6' (182.9 cm) Weight: 169 lb (76.7 kg) IBW/kg (Calculated) : 77.6 Heparin Dosing Weight: 76 kg  Vital Signs: Temp: 98.6 F (37 C) (05/10 1025) Temp Source: Oral (05/10 1025) BP: 121/92 (05/10 1254) Pulse Rate: 37 (05/10 1254)  Labs: Recent Labs    08/14/18 1027  HGB 16.8  HCT 48.5  PLT 197  CREATININE 0.84  TROPONINI <0.03    Estimated Creatinine Clearance: 105.3 mL/min (by C-G formula based on SCr of 0.84 mg/dL).   Medical History: Past Medical History:  Diagnosis Date  . Atrial fibrillation (HCC)    Details unclear - patient states diagnosed in his 6s  . Bicuspid aortic valve   . Hyperlipidemia   . Sarcoidosis   . Type 2 diabetes mellitus (HCC)     Medications:  (Not in a hospital admission)   Assessment: Pharmacy consulted to dose heparin in patient with atrial fibrillation.  Patient was not on anticoagulation prior to admission.  Goal of Therapy:  Heparin level 0.3-0.7 units/ml Monitor platelets by anticoagulation protocol: Yes   Plan:  Give 4500 units bolus x 1 Start heparin infusion at 1100 units/hr Check anti-Xa level in 6-8 hours and daily while on heparin Continue to monitor H&H and platelets  Tad Moore 08/14/2018,12:57 PM

## 2018-08-14 NOTE — Progress Notes (Signed)
CBG 272. 

## 2018-08-14 NOTE — Progress Notes (Signed)
Patient's HR 55-60 on cardizem drip. Drip stopped, mid level made aware.

## 2018-08-14 NOTE — H&P (Signed)
History and Physical    British Lender TAV:[id number] DOB: 07/18/1960 DOA: 08/14/2018  PCP: Benita Stabile, MD   Cardiologist: Dr. Diona Browner  Patient coming from: Home  Chief Complaint: Palpitations and dyspnea  HPI: Jeffery Beard is a 58 y.o. male with medical history significant for chronic diastolic heart failure, type 2 diabetes-insulin-dependent, pulmonary sarcoidosis, questionable remote history of atrial fibrillation, and bicuspid aortic valve with moderate aortic stenosis who presented to the emergency department with some worsening palpitations and dyspnea that began earlier this morning at approximately 5 AM and worsened by 7 AM.  He was noted to have some weakness, but denied any chest pain, nausea, vomiting, abdominal pain, dizziness, or lightheadedness.  He was noticing some worsening fatigue as he was getting some propane tanks fell this a.m.  He does have heavy daily caffeine intake and no recent changes to his medications which she has been taking on a regular basis.  He denies any cough, fevers, or chills.   ED Course: Patient was noted to be in atrial fibrillation with RVR in the emergency department.  He was initially thought to have SVT and received some adenosine with transient improvement in underlying atrial fibrillation was appreciated.  He was given Cardizem bolus with temporary improvement in his heart rate went back into the 110 to 120 bpm range for which he was placed on a Cardizem drip at this time.  His rates currently remain in the 110s and he is otherwise asymptomatic.  Review of Systems: All others reviewed and otherwise negative.  Past Medical History:  Diagnosis Date  . Atrial fibrillation (HCC)    Details unclear - patient states diagnosed in his 30s  . Bicuspid aortic valve   . Hyperlipidemia   . Sarcoidosis   . Type 2 diabetes mellitus (HCC)     Past Surgical History:  Procedure Laterality Date  . LUNG BIOPSY    . Nasal septum surgery    .  PALATE / UVULA BIOPSY / EXCISION    . TONSILLECTOMY    . VASECTOMY       reports that he has been smoking cigarettes. He has a 25.00 pack-year smoking history. He has never used smokeless tobacco. He reports that he does not drink alcohol or use drugs.  Allergies  Allergen Reactions  . Codeine   . Hydrocodone-Acetaminophen     Other reaction(s): Other (See Comments) Nervousness and itching    Family History  Problem Relation Age of Onset  . COPD Mother   . High blood pressure Mother   . Cancer - Prostate Father   . High blood pressure Father     Prior to Admission medications   Medication Sig Start Date End Date Taking? Authorizing Provider  acetaminophen (TYLENOL) 650 MG CR tablet Take 650 mg by mouth every 8 (eight) hours as needed for pain.   Yes [provider]  CHANTIX 1 MG tablet Take 1 tablet by mouth 2 (two) times a day. 06/22/18  Yes [provider]  ibuprofen (ADVIL) 200 MG tablet Take 400-600 mg by mouth every 6 (six) hours as needed.   Yes [provider]  Insulin Glargine, 1 Unit Dial, (TOUJEO SOLOSTAR) 300 UNIT/ML SOPN Inject 60 Units into the skin at bedtime. 07/20/18  Yes Nida, Denman George, MD  Insulin Lispro (HUMALOG KWIKPEN) 200 UNIT/ML SOPN Inject 5-14 Units into the skin 3 (three) times daily before meals. 07/20/18  Yes Roma Kayser, MD    Physical Exam: Vitals:   08/14/18 1115  08/14/18 1130 08/14/18 1145 08/14/18 1200  BP:   113/74 131/86  Pulse: 83 (!) 56 (!) 50 (!) 46  Resp: Temp:      TempSrc:      SpO2: 99% 99% 100% 97%  Weight:      Height:        Constitutional: NAD, calm, comfortable Vitals:   08/14/18 1115 08/14/18 1130 08/14/18 1145 08/14/18 1200  BP:   113/74 131/86  Pulse: 83 (!) 56 (!) 50 (!) 46  Resp: Temp:      TempSrc:      SpO2: 99% 99% 100% 97%  Weight:      Height:       Eyes: lids and conjunctivae normal ENMT: Mucous membranes are moist.  Neck: normal,  supple Respiratory: clear to auscultation bilaterally. Normal respiratory effort. No accessory muscle use.  Cardiovascular: Irregular and tachycardic, no murmurs. No extremity edema. Abdomen: no tenderness, no distention. Bowel sounds positive.  Musculoskeletal:  No joint deformity upper and lower extremities.   Skin: no rashes, lesions, ulcers.  Psychiatric: Normal judgment and insight. Alert and oriented x 3. Normal mood.   Labs on Admission: I have personally reviewed following labs and imaging studies  CBC: Recent Labs  Lab 08/14/18 1027  WBC 10.8*  NEUTROABS 8.1*  HGB 16.8  HCT 48.5  MCV 88.7  PLT 197   Basic Metabolic Panel: Recent Labs  Lab 08/14/18 1027  NA 139  K 3.6  CL 102  CO2 25  GLUCOSE 207*  BUN 20  CREATININE 0.84  CALCIUM 9.1   GFR: Estimated Creatinine Clearance: 105.3 mL/min (by C-G formula based on SCr of 0.84 mg/dL). Liver Function Tests: No results for input(s): AST, ALT, ALKPHOS, BILITOT, PROT, ALBUMIN in the last 168 hours. No results for input(s): LIPASE, AMYLASE in the last 168 hours. No results for input(s): AMMONIA in the last 168 hours. Coagulation Profile: No results for input(s): INR, PROTIME in the last 168 hours. Cardiac Enzymes: Recent Labs  Lab 08/14/18 1027  TROPONINI <0.03   BNP (last 3 results) No results for input(s): PROBNP in the last 8760 hours. HbA1C: No results for input(s): HGBA1C in the last 72 hours. CBG: Recent Labs  Lab 08/14/18 1110  GLUCAP 199*   Lipid Profile: No results for input(s): CHOL, HDL, LDLCALC, TRIG, CHOLHDL, LDLDIRECT in the last 72 hours. Thyroid Function Tests: No results for input(s): TSH, T4TOTAL, FREET4, T3FREE, THYROIDAB in the last 72 hours. Anemia Panel: No results for input(s): VITAMINB12, FOLATE, FERRITIN, TIBC, IRON, RETICCTPCT in the last 72 hours. Urine analysis: No results found for: COLORURINE, APPEARANCEUR, LABSPEC, PHURINE, GLUCOSEU, HGBUR, BILIRUBINUR, KETONESUR,  PROTEINUR, UROBILINOGEN, NITRITE, LEUKOCYTESUR  Radiological Exams on Admission: Dg Chest Portable 1 View  Result Date: 08/14/2018 CLINICAL DATA:  Chest pain, shortness of breath. EXAM: PORTABLE CHEST 1 VIEW COMPARISON:  None. FINDINGS: The heart size and mediastinal contours are within normal limits. No pneumothorax or pleural effusion is noted. Left lung is clear. Right upper lobe nodular density is noted. The visualized skeletal structures are unremarkable. IMPRESSION: Right upper lobe nodular density is noted concerning for pulmonary nodule or mass. CT scan of the chest is recommended for further evaluation. Electronically Signed   By: Lupita Raider M.D.   On: 08/14/2018 11:48    Assessment/Plan Active Problems:   Atrial fibrillation with RVR (HCC)    1. Atrial fibrillation with RVR.  Prior 2D echocardiogram performed on 03/2018 and  will repeat at this time given history of moderate aortic stenosis and grade 1 diastolic dysfunction.  Maintain on Cardizem drip and initiate metoprolol 25 mg twice daily.  Patient's chads vasc score is 2 and will start on heparin drip as a result.  Plan to wean Cardizem drip as tolerated.  Consult to cardiology in a.m. for further evaluation.  Monitor on telemetry. 2. Moderate aortic stenosis with bicuspid aortic valve.  Prior evaluation on 03/2018 as noted above.  Will repeat 2D echocardiogram and monitor. 3. Type 2 diabetes with hyperglycemia.  Maintain on home regimen with SSI and carb modified diet. 4. Right lung upper lobe nodular density.  No prior CT noted in our system and has history of pulmonary sarcoidosis based on biopsy.  Will obtain CT scan of the chest for baseline evaluation and should follow-up with pulmonology in the outpatient setting.   DVT prophylaxis: Heparin gtt Code Status: Full Family Communication: None at bedside Disposition Plan: Admit for heart rate control and cardiology evaluation Consults called: Cardiology in a.m. Admission  status: Inpatient, stepdown unit   Mukesh Kornegay Hoover BrunetteD Loyola Santino DO [facility] Pager [phone]  If 7PM-7AM, please contact night-coverage [email or web address] Password [email or web address]  08/14/2018, 12:16 PM

## 2018-08-14 NOTE — ED Notes (Signed)
HR slowed to 120. Underlying rhythm Afib. HR increased back 174. Cardizem drip per EDP.

## 2018-08-14 NOTE — ED Triage Notes (Signed)
Pt states having central chest pain with sob sicne 0700. HX of afib

## 2018-08-14 NOTE — ED Notes (Signed)
Pt placed on zoll pads. EDP Zackowski and Idol at bedside.

## 2018-08-15 ENCOUNTER — Inpatient Hospital Stay (HOSPITAL_COMMUNITY): Payer: Managed Care, Other (non HMO)

## 2018-08-15 DIAGNOSIS — I340 Nonrheumatic mitral (valve) insufficiency: Secondary | ICD-10-CM

## 2018-08-15 DIAGNOSIS — I351 Nonrheumatic aortic (valve) insufficiency: Secondary | ICD-10-CM

## 2018-08-15 DIAGNOSIS — E119 Type 2 diabetes mellitus without complications: Secondary | ICD-10-CM

## 2018-08-15 DIAGNOSIS — D860 Sarcoidosis of lung: Secondary | ICD-10-CM

## 2018-08-15 DIAGNOSIS — Q231 Congenital insufficiency of aortic valve: Secondary | ICD-10-CM

## 2018-08-15 DIAGNOSIS — I350 Nonrheumatic aortic (valve) stenosis: Secondary | ICD-10-CM

## 2018-08-15 DIAGNOSIS — Z794 Long term (current) use of insulin: Secondary | ICD-10-CM

## 2018-08-15 DIAGNOSIS — Z7189 Other specified counseling: Secondary | ICD-10-CM

## 2018-08-15 DIAGNOSIS — I10 Essential (primary) hypertension: Secondary | ICD-10-CM

## 2018-08-15 LAB — GLUCOSE, CAPILLARY
Glucose-Capillary: 109 mg/dL — ABNORMAL HIGH (ref 70–99)
Glucose-Capillary: 146 mg/dL — ABNORMAL HIGH (ref 70–99)
Glucose-Capillary: 88 mg/dL (ref 70–99)
Glucose-Capillary: 178 mg/dL — ABNORMAL HIGH (ref 70–99)

## 2018-08-15 LAB — CBC
HCT: 43.1 % (ref 39.0–52.0)
Hemoglobin: 14.6 g/dL (ref 13.0–17.0)
MCH: 30.5 pg (ref 26.0–34.0)
MCHC: 33.9 g/dL (ref 30.0–36.0)
MCV: 90.2 fL (ref 80.0–100.0)
Platelets: 160 10*3/uL (ref 150–400)
RBC: 4.78 MIL/uL (ref 4.22–5.81)
RDW: 12.3 % (ref 11.5–15.5)
WBC: 6.8 10*3/uL (ref 4.0–10.5)
nRBC: 0 % (ref 0.0–0.2)

## 2018-08-15 LAB — MAGNESIUM: Magnesium: 1.8 mg/dL (ref 1.7–2.4)

## 2018-08-15 LAB — BASIC METABOLIC PANEL
Anion gap: 6 (ref 5–15)
BUN: 14 mg/dL (ref 6–20)
CO2: 26 mmol/L (ref 22–32)
Calcium: 8.8 mg/dL — ABNORMAL LOW (ref 8.9–10.3)
Chloride: 109 mmol/L (ref 98–111)
Creatinine, Ser: 0.69 mg/dL (ref 0.61–1.24)
GFR calc Af Amer: >60 mL/min (ref >60)
GFR calc non Af Amer: >60 mL/min (ref >60)
Glucose, Bld: 78 mg/dL (ref 70–99)
Potassium: 3.3 mmol/L — ABNORMAL LOW (ref 3.5–5.1)
Sodium: 141 mmol/L (ref 135–145)

## 2018-08-15 LAB — ECHOCARDIOGRAM LIMITED
Height: 72 in
Weight: 2694.9 oz

## 2018-08-15 LAB — HEPARIN LEVEL (UNFRACTIONATED): Heparin Unfractionated: 0.44 IU/mL (ref 0.30–0.70)

## 2018-08-15 LAB — TROPONIN I: Troponin I: <0.03 ng/mL (ref <0.03)

## 2018-08-15 MED ORDER — APIXABAN 5 MG PO TABS
5.0000 mg | ORAL_TABLET | Freq: Two times a day (BID) | ORAL | Status: DC
  Administered 2018-08-15 – 2018-08-16 (×3): 5 mg via ORAL
  Filled 2018-08-15 (×3): qty 1

## 2018-08-15 MED ORDER — CHLORHEXIDINE GLUCONATE CLOTH 2 % EX PADS
6.0000 | MEDICATED_PAD | Freq: Every day | CUTANEOUS | Status: DC
  Administered 2018-08-15: 6 via TOPICAL

## 2018-08-15 MED ORDER — DILTIAZEM HCL 30 MG PO TABS
30.0000 mg | ORAL_TABLET | Freq: Four times a day (QID) | ORAL | Status: DC
  Administered 2018-08-15 – 2018-08-16 (×4): 30 mg via ORAL
  Filled 2018-08-15 (×4): qty 1

## 2018-08-15 MED ORDER — LACTATED RINGERS IV BOLUS
500.0000 mL | Freq: Once | INTRAVENOUS | Status: AC
  Administered 2018-08-15: 500 mL via INTRAVENOUS

## 2018-08-15 MED ORDER — POTASSIUM CHLORIDE CRYS ER 20 MEQ PO TBCR
40.0000 meq | EXTENDED_RELEASE_TABLET | Freq: Once | ORAL | Status: AC
  Administered 2018-08-15: 40 meq via ORAL
  Filled 2018-08-15: qty 2

## 2018-08-15 NOTE — Progress Notes (Signed)
ANTICOAGULATION CONSULT NOTE -   Pharmacy Consult for heparin Indication: atrial fibrillation  Allergies  Allergen Reactions  . Codeine   . Hydrocodone-Acetaminophen     Other reaction(s): Other (See Comments) Nervousness and itching    Patient Measurements: Height: 6' (182.9 cm) Weight: 168 lb 6.9 oz (76.4 kg) IBW/kg (Calculated) : 77.6 Heparin Dosing Weight: 76 kg  Vital Signs: Temp: 97.7 F (36.5 C) (05/11 0700) Temp Source: Oral (05/11 0700) BP: 103/72 (05/11 0830) Pulse Rate: 78 (05/11 0830)  Labs: Recent Labs    08/14/18 1027 08/14/18 1302 08/14/18 1946 08/15/18 0036 08/15/18 0521  HGB 16.8  --   --   --  14.6  HCT 48.5  --   --   --  43.1  PLT 197  --   --   --  160  APTT  --  29  --   --   --   LABPROT  --  13.5  --   --   --   INR  --  1.0  --   --   --   HEPARINUNFRC  --   --  0.73*  --  0.44  CREATININE 0.84  --   --   --  0.69  TROPONINI <0.03 <0.03 <0.03 <0.03  --     Estimated Creatinine Clearance: 110.1 mL/min (by C-G formula based on SCr of 0.69 mg/dL).   Medical History: Past Medical History:  Diagnosis Date  . Atrial fibrillation (HCC)    Details unclear - patient states diagnosed in his 69s  . Bicuspid aortic valve   . Hyperlipidemia   . Sarcoidosis   . Type 2 diabetes mellitus (HCC)     Medications:  Medications Prior to Admission  Medication Sig Dispense Refill Last Dose  . acetaminophen (TYLENOL) 650 MG CR tablet Take 650 mg by mouth every 8 (eight) hours as needed for pain.   Past Month at Unknown time  . CHANTIX 1 MG tablet Take 1 tablet by mouth 2 (two) times a day.   08/14/2018 at 0800  . ibuprofen (ADVIL) 200 MG tablet Take 400-600 mg by mouth every 6 (six) hours as needed.   Past Week at Unknown time  . Insulin Glargine, 1 Unit Dial, (TOUJEO SOLOSTAR) 300 UNIT/ML SOPN Inject 60 Units into the skin at bedtime. 27 mL 0 08/13/2018 at 2100  . Insulin Lispro (HUMALOG KWIKPEN) 200 UNIT/ML SOPN Inject 5-14 Units into the skin 3  (three) times daily before meals. 5 pen 5 08/13/2018 at 2000    Assessment: Pharmacy consulted to dose heparin in patient with atrial fibrillation.  Patient was not on anticoagulation prior to admission. Heparin level is therapeutic this AM.   Goal of Therapy:  Heparin level 0.3-0.7 units/ml Monitor platelets by anticoagulation protocol: Yes   Plan:  Continue heparin infusion at 1000 units/hr Check anti-Xa level daily while on heparin. Continue to monitor H&H and platelets.  Elder Cyphers, BS Loura Back, BCPS Clinical Pharmacist Pager [phone] 08/15/2018,9:29 AM

## 2018-08-15 NOTE — TOC Progression Note (Signed)
Reviewed pt's record. Pt low risk on re-admission scale. No immediate TOC needs identified. Will be available if needs arise.

## 2018-08-15 NOTE — Progress Notes (Signed)
  Echocardiogram 2D Echocardiogram has been performed.  Delcie Roch 08/15/2018, 1:11 PM

## 2018-08-15 NOTE — Progress Notes (Signed)
Patient's BP dropping, mid level aware.

## 2018-08-15 NOTE — Progress Notes (Signed)
PROGRESS NOTE    Jeffery Beard  ZOX:[id number] DOB: [birth year] DOA: 08/14/2018 PCP: Benita Stabile, MD   Brief Narrative:  Per HPI: Jeffery Beard is a 58 y.o. male with medical history significant for chronic diastolic heart failure, type 2 diabetes-insulin-dependent, pulmonary sarcoidosis, questionable remote history of atrial fibrillation, and bicuspid aortic valve with moderate aortic stenosis who presented to the emergency department with some worsening palpitations and dyspnea that began earlier this morning at approximately 5 AM and worsened by 7 AM.  He was noted to have some weakness, but denied any chest pain, nausea, vomiting, abdominal pain, dizziness, or lightheadedness.  He was noticing some worsening fatigue as he was getting some propane tanks fell this a.m.  He does have heavy daily caffeine intake and no recent changes to his medications which she has been taking on a regular basis.  He denies any cough, fevers, or chills.  Patient was admitted with atrial fibrillation with RVR with 2D echocardiogram still pending.  Heart rates are better controlled and patient has been weaned off Cardizem drip.  Cardiology has evaluated patient on 5/11 and recommended oral Cardizem at this time.  Heparin drip has been converted over to Eliquis as well.  CT of the chest performed with spiculated 1.4 cm mass in the right upper lobe noted which will need close follow-up.  Patient is also noted to have an ascending aortic aneurysm which will need close monitoring.  Assessment & Plan:   Active Problems:   Atrial fibrillation with RVR (HCC)  1. Atrial fibrillation with RVR.  Prior 2D echocardiogram performed on 03/2018 and will repeat at this time given history of moderate aortic stenosis and grade 1 diastolic dysfunction.  Metoprolol discontinued in favor of oral Cardizem by cardiology.  Heparin drip discontinued and started on Eliquis 5 mg twice daily.  Appreciate further recommendations.   Continue monitoring on telemetry. 2. Moderate aortic stenosis with bicuspid aortic valve.  Prior evaluation on 03/2018 as noted above.  Will repeat 2D echocardiogram and monitor. 3. Type 2 diabetes with hyperglycemia.  Maintain on home regimen with SSI and carb modified diet.  Monitor closely. 4. Right lung upper lobe nodular density.    Patient has prior history of tobacco abuse and pulmonary sarcoidosis.  Mass appears to be spiculated and appears to be likely cancerous and will need further evaluation and work-up.  I have suggested follow-up with pulmonology in the outpatient setting as well as repeat CT and/or need of PET in 3 months. 5. Ascending aortic aneurysm.  Will require repeat CT chest monitoring in 3 months. 6. Hypokalemia.  Replete orally and recheck in a.m. along with magnesium.   DVT prophylaxis: Eliquis Code Status: Full Family Communication: None at bedside, patient will call wife Disposition Plan: Continue to monitor for heart rate control now on Cardizem.  2D echocardiogram pending.  Per cardiology recommendations, but anticipate discharge in the next 24 hours if stable and improved.   Consultants:   Cardiology  Procedures:   None  Antimicrobials:   None   Subjective: Patient seen and evaluated today with no new acute complaints or concerns. No acute concerns or events noted overnight.  Heart rates remain improved this morning, but labile.  Patient has no specific complaints and has been weaned off Cardizem drip.  Objective: Vitals:   08/15/18 1000 08/15/18 1015 08/15/18 1030 08/15/18 1045  BP: 100/77 104/76 (!) 124/93 101/81  Pulse: 72 (!) 112 75 86  Resp: 14   16  Temp:  TempSrc:      SpO2: 98% 98% 100% 97%  Weight:      Height:        Intake/Output Summary (Last 24 hours) at 08/15/2018 1049 Last data filed at 08/15/2018 1000 Gross per 24 hour  Intake 1070.72 ml  Output 1700 ml  Net -629.28 ml   Filed Weights   08/14/18 1022 08/14/18 1429  08/15/18 0645  Weight: 76.7 kg 76.2 kg 76.4 kg    Examination:  General exam: Appears calm and comfortable  Respiratory system: Clear to auscultation. Respiratory effort normal. Cardiovascular system: S1 & S2 heard, irregular and tachycardic.  No JVD, murmurs, rubs, gallops or clicks. No pedal edema. Gastrointestinal system: Abdomen is nondistended, soft and nontender. No organomegaly or masses felt. Normal bowel sounds heard. Central nervous system: Alert and oriented. No focal neurological deficits. Extremities: Symmetric 5 x 5 power. Skin: No rashes, lesions or ulcers Psychiatry: Judgement and insight appear normal. Mood & affect appropriate.     Data Reviewed: I have personally reviewed following labs and imaging studies  CBC: Recent Labs  Lab 08/14/18 1027 08/15/18 0521  WBC 10.8* 6.8  NEUTROABS 8.1*  --   HGB 16.8 14.6  HCT 48.5 43.1  MCV 88.7 90.2  PLT 197 160   Basic Metabolic Panel: Recent Labs  Lab 08/14/18 1027 08/15/18 0521  NA 139 141  K 3.6 3.3*  CL 102 109  CO2 25 26  GLUCOSE 207* 78  BUN 20 14  CREATININE 0.84 0.69  CALCIUM 9.1 8.8*  MG  --  1.8   GFR: Estimated Creatinine Clearance: 110.1 mL/min (by C-G formula based on SCr of 0.69 mg/dL). Liver Function Tests: No results for input(s): AST, ALT, ALKPHOS, BILITOT, PROT, ALBUMIN in the last 168 hours. No results for input(s): LIPASE, AMYLASE in the last 168 hours. No results for input(s): AMMONIA in the last 168 hours. Coagulation Profile: Recent Labs  Lab 08/14/18 1302  INR 1.0   Cardiac Enzymes: Recent Labs  Lab 08/14/18 1027 08/14/18 1302 08/14/18 1946 08/15/18 0036  TROPONINI <0.03 <0.03 <0.03 <0.03   BNP (last 3 results) No results for input(s): PROBNP in the last 8760 hours. HbA1C: No results for input(s): HGBA1C in the last 72 hours. CBG: Recent Labs  Lab 08/14/18 1110 08/14/18 1601 08/14/18 2142 08/15/18 0716  GLUCAP 199* 250* 184* 109*   Lipid Profile: No results  for input(s): CHOL, HDL, LDLCALC, TRIG, CHOLHDL, LDLDIRECT in the last 72 hours. Thyroid Function Tests: Recent Labs    08/14/18 1027  TSH 8.147*   Anemia Panel: No results for input(s): VITAMINB12, FOLATE, FERRITIN, TIBC, IRON, RETICCTPCT in the last 72 hours. Sepsis Labs: No results for input(s): PROCALCITON, LATICACIDVEN in the last 168 hours.  Recent Results (from the past 240 hour(s))  SARS Coronavirus 2 (CEPHEID - Performed in [facility] hospital lab), Hosp Order     Status: None   Collection Time: 08/14/18 11:08 AM  Result Value Ref Range Status   SARS Coronavirus 2 NEGATIVE NEGATIVE Final    Comment: (NOTE) If result is NEGATIVE SARS-CoV-2 target nucleic acids are NOT DETECTED. The SARS-CoV-2 RNA is generally detectable in upper and lower  respiratory specimens during the acute phase of infection. The lowest  concentration of SARS-CoV-2 viral copies this assay can detect is 250  copies / mL. A negative result does not preclude SARS-CoV-2 infection  and should not be used as the sole basis for treatment or other  patient management decisions.  A negative result  may occur with  improper specimen collection / handling, submission of specimen other  than nasopharyngeal swab, presence of viral mutation(s) within the  areas targeted by this assay, and inadequate number of viral copies  (<250 copies / mL). A negative result must be combined with clinical  observations, patient history, and epidemiological information. If result is POSITIVE SARS-CoV-2 target nucleic acids are DETECTED. The SARS-CoV-2 RNA is generally detectable in upper and lower  respiratory specimens dur ing the acute phase of infection.  Positive  results are indicative of active infection with SARS-CoV-2.  Clinical  correlation with patient history and other diagnostic information is  necessary to determine patient infection status.  Positive results do  not rule out bacterial infection or co-infection  with other viruses. If result is PRESUMPTIVE POSTIVE SARS-CoV-2 nucleic acids MAY BE PRESENT.   A presumptive positive result was obtained on the submitted specimen  and confirmed on repeat testing.  While 2019 novel coronavirus  (SARS-CoV-2) nucleic acids may be present in the submitted sample  additional confirmatory testing may be necessary for epidemiological  and / or clinical management purposes  to differentiate between  SARS-CoV-2 and other Sarbecovirus currently known to infect humans.  If clinically indicated additional testing with an alternate test  methodology [phone]) is advised. The SARS-CoV-2 RNA is generally  detectable in upper and lower respiratory sp ecimens during the acute  phase of infection. The expected result is Negative. Fact Sheet for Patients:  BoilerBrush.com.cy Fact Sheet for Healthcare Providers: https://pope.com/ This test is not yet approved or cleared by the [place] FDA and has been authorized for detection and/or diagnosis of SARS-CoV-2 by FDA under an Emergency Use Authorization (EUA).  This EUA will remain in effect (meaning this test can be used) for the duration of the COVID-19 declaration under Section 564(b)(1) of the Act, 21 U.S.C. section 360bbb-3(b)(1), unless the authorization is terminated or revoked sooner. Performed at [facility], [address] [place], [place] [address]   MRSA PCR Screening     Status: None   Collection Time: 08/14/18  2:36 PM  Result Value Ref Range Status   MRSA by PCR NEGATIVE NEGATIVE Final    Comment:        The GeneXpert MRSA Assay (FDA approved for NASAL specimens only), is one component of a comprehensive MRSA colonization surveillance program. It is not intended to diagnose MRSA infection nor to guide or monitor treatment for MRSA infections. Performed at [facility], [address] [place], [place] [address]          Radiology  Studies: Ct Chest Wo Contrast  Result Date: 08/14/2018 CLINICAL DATA:  Follow-up nodular density on recent chest x-ray EXAM: CT CHEST WITHOUT CONTRAST TECHNIQUE: Multidetector CT imaging of the chest was performed following the standard protocol without IV contrast. COMPARISON:  Plain film from earlier in the same day. FINDINGS: Cardiovascular: Somewhat limited due to lack of IV contrast. Ascending aortic aneurysmal dilatation to 4.9 cm is noted. Mild atherosclerotic calcifications are seen. Heart is not significantly enlarged. No pericardial effusion is seen. Mediastinum/Nodes: Thoracic inlet is within normal limits. No hilar or mediastinal adenopathy is noted. The esophagus as visualized is within normal limits. Lungs/Pleura: Lungs are well aerated bilaterally with mild emphysematous changes. In the right upper lobe there is a 14 mm spiculated lesion which corresponds to that seen on prior plain film examination consistent with primary pulmonary neoplasm till proven otherwise. No other parenchymal nodules are seen. No sizable effusion is noted. Upper  Abdomen: No acute abnormality. Musculoskeletal: Degenerative changes of the thoracic spine are noted. IMPRESSION: 14 mm spiculated lesion in the right upper lobe consistent with primary pulmonary neoplasm till proven otherwise. Consider one of the following in 3 months for both low-risk and high-risk individuals: (a) repeat chest CT, (b) follow-up PET-CT, or (c) tissue sampling. This recommendation follows the consensus statement: Guidelines for Management of Incidental Pulmonary Nodules Detected on CT Images: From the Fleischner Society 2017; Radiology 2017; 284:228-243. Ascending thoracic aortic aneurysm measuring 4.9 cm. Recommend semi-annual imaging followup by CTA or MRA and referral to cardiothoracic surgery if not already obtained. This recommendation follows 2010 ACCF/AHA/AATS/ACR/ASA/SCA/SCAI/SIR/STS/SVM Guidelines for the Diagnosis and Management of  Patients With Thoracic Aortic Disease. Circulation. 2010; 121: Z610-R604: E266-e369. Aortic aneurysm NOS (ICD10-I71.9) Aortic Atherosclerosis (ICD10-I70.0) and Emphysema (ICD10-J43.9). Electronically Signed   By: Alcide CleverMark  Lukens M.D.   On: 08/14/2018 17:17   Dg Chest Portable 1 View  Result Date: 08/14/2018 CLINICAL DATA:  Chest pain, shortness of breath. EXAM: PORTABLE CHEST 1 VIEW COMPARISON:  None. FINDINGS: The heart size and mediastinal contours are within normal limits. No pneumothorax or pleural effusion is noted. Left lung is clear. Right upper lobe nodular density is noted. The visualized skeletal structures are unremarkable. IMPRESSION: Right upper lobe nodular density is noted concerning for pulmonary nodule or mass. CT scan of the chest is recommended for further evaluation. Electronically Signed   By: Lupita RaiderJames  Green Jr M.D.   On: 08/14/2018 11:48        Scheduled Meds:  apixaban  5 mg Oral BID   Chlorhexidine Gluconate Cloth  6 each Topical Daily   diltiazem  30 mg Oral Q6H   Insulin Glargine (1 Unit Dial)  60 Units Subcutaneous QHS   insulin lispro  5-14 Units Subcutaneous TID WC   potassium chloride  40 mEq Oral Once   sodium chloride flush  3 mL Intravenous Q12H   varenicline  1 mg Oral BID WC   Continuous Infusions:  sodium chloride 10 mL/hr at 08/15/18 1000     LOS: 1 day    Time spent: 30 minutes    Jouri Threat Hoover BrunetteD Graceann Boileau, DO [facility] Pager [phone]  If 7PM-7AM, please contact night-coverage [email or web address] Password [email or web address] 08/15/2018, 10:49 AM

## 2018-08-15 NOTE — Progress Notes (Signed)
Pt reported he got up to have a BM and when he went back to bed noticed he was in NSR. Rhythm strip captured, measured, and placed in chart.

## 2018-08-15 NOTE — Consult Note (Addendum)
Cardiology Consultation:   Patient ID: Jeffery Beard MRN: [id number]; DOB: March 30, 1961  Admit date: 08/14/2018 Date of Consult: 08/15/2018  Primary Care Provider: Benita Stabile, MD Primary Cardiologist: Nona Dell, MD  Primary Electrophysiologist:  None    Patient Profile:   Jeffery Beard is a 58 y.o. male with a hx of bicuspid aortic valve stenosis and paroxysmal atrial fibrillation who is being seen today for the evaluation of rapid atrial fibrillation at the request of Dr. Sherryll Burger.  History of Present Illness:   Jeffery Beard is a 58 year old male with a history of bicuspid aortic valve stenosis, pulmonary sarcoidosis, and paroxysmal atrial fibrillation.  He was most recently evaluated by Dr. Diona Browner in the office on 05/06/2018.  It appears an event monitor was ordered in December 2019 but he had some difficulty obtaining it.  He presented to the [facility], ED on 08/14/2018 with complaints of palpitations and shortness of breath.  He was noted to be in rapid atrial fibrillation in the ED.  It was initially thought he had SVT and was given some adenosine after which atrial fibrillation was appreciated.  He was started on a Cardizem infusion which has subsequently been weaned off.  He is currently on Lopressor 25 mg twice daily.  Heart rates are in the 80-90 bpm range with occasional tachycardic episodes.  He currently denies chest pain and shortness of breath.  He said he like to get up and walk around.  He has intermittent palpitations.  He told me he has a long history of hypertension with systolic readings getting as high as the 180-200 range between the age of 70 and 22.  He said he was very anxious and stressed during that period of his life.  He sporadically checks his blood pressure at home and is not on antihypertensive therapy.  He said blood pressures typically range in the 120-150/80-100 range.  His wife has a history of a stroke and is home alone.  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    Past Medical History:  Diagnosis Date   Atrial fibrillation [facility])    Details unclear - patient states diagnosed in his 45s   Bicuspid aortic valve    Hyperlipidemia    Sarcoidosis    Type 2 diabetes mellitus (HCC)     Past Surgical History:  Procedure Laterality Date   LUNG BIOPSY     Nasal septum surgery     PALATE / UVULA BIOPSY / EXCISION     TONSILLECTOMY     VASECTOMY         Inpatient Medications: Scheduled Meds:  Chlorhexidine Gluconate Cloth  6 each Topical Daily   Insulin Glargine (1 Unit Dial)  60 Units Subcutaneous QHS   insulin lispro  5-14 Units Subcutaneous TID WC   metoprolol tartrate  25 mg Oral BID   sodium chloride flush  3 mL Intravenous Q12H   varenicline  1 mg Oral BID WC   Continuous Infusions:  sodium chloride 10 mL/hr at 08/15/18 0300   diltiazem (CARDIZEM) infusion Stopped (08/14/18 2215)   heparin 1,000 Units/hr (08/15/18 0828)   PRN Meds: sodium chloride, acetaminophen **OR** acetaminophen, ondansetron **OR** ondansetron (ZOFRAN) IV, sodium chloride flush  Allergies:    Allergies  Allergen Reactions   Codeine    Hydrocodone-Acetaminophen     Other reaction(s): Other (See Comments) Nervousness and itching    Social History:   Social History   Socioeconomic History   Marital status: Married    Spouse name:  Not on file   Number of children: Not on file   Years of education: Not on file   Highest education level: Not on file  Occupational History   Not on file  Social Needs   Financial resource strain: Not on file   Food insecurity:    Worry: Not on file    Inability: Not on file   Transportation needs:    Medical: Not on file    Non-medical: Not on file  Tobacco Use   Smoking status: Current Every Day Smoker    Packs/day: 1.00    Years: 25.00    Pack years: 25.00    Types: Cigarettes   Smokeless tobacco: Never Used  Substance and Sexual Activity   Alcohol use:  Never    Frequency: Never   Drug use: Never   Sexual activity: Not on file  Lifestyle   Physical activity:    Days per week: Not on file    Minutes per session: Not on file   Stress: Not on file  Relationships   Social connections:    Talks on phone: Not on file    Gets together: Not on file    Attends religious service: Not on file    Active member of club or organization: Not on file    Attends meetings of clubs or organizations: Not on file    Relationship status: Not on file   Intimate partner violence:    Fear of current or ex partner: Not on file    Emotionally abused: Not on file    Physically abused: Not on file    Forced sexual activity: Not on file  Other Topics Concern   Not on file  Social History Narrative   Not on file    Family History:    Family History  Problem Relation Age of Onset   COPD Mother    High blood pressure Mother    Cancer - Prostate Father    High blood pressure Father      ROS:  Please see the history of present illness.   All other ROS reviewed and negative.     Physical Exam/Data:   Vitals:   08/15/18 0700 08/15/18 0758 08/15/18 0800 08/15/18 0830  BP:   96/73 103/72  Pulse:   (!) 50 78  Resp:   15 18  Temp: 97.7 F (36.5 C)     TempSrc: Oral     SpO2:  96% 98% 98%  Weight:      Height:        Intake/Output Summary (Last 24 hours) at 08/15/2018 1003 Last data filed at 08/15/2018 0600 Gross per 24 hour  Intake 930.8 ml  Output 1700 ml  Net -769.2 ml   Last 3 Weights 08/15/2018 08/14/2018 08/14/2018  Weight (lbs) 168 lb 6.9 oz 167 lb 15.9 oz 169 lb  Weight (kg) 76.4 kg 76.2 kg 76.658 kg     Body mass index is 22.84 kg/m.  General:  Well nourished, well developed, in no acute distress HEENT: normal Lymph: no adenopathy Neck: no JVD Endocrine:  No thryomegaly Cardiac: Normal S1/S2, no S3, irregular rhythm, 2/6 systolic murmur over right upper sternal border Lungs:  clear to auscultation bilaterally, no  wheezing, rhonchi or rales  Abd: soft, nontender, no hepatomegaly  Ext: no edema Musculoskeletal:  No deformities, BUE and BLE strength normal and equal Skin: warm and dry  Neuro:  CNs 2-12 intact, no focal abnormalities noted Psych:  Normal affect  EKG:  The EKG was personally reviewed and demonstrates: Last uploaded ECG in EMR is dated 03/14/2018 which showed sinus rhythm with PACs.  Telemetry:  Telemetry was personally reviewed and demonstrates: Atrial fibrillation with heart rates currently in the 80-90 bpm range with occasional tachycardic episodes.  Relevant CV Studies: Echocardiogram 03/17/2018: Study Conclusions  - Left ventricle: The cavity size was normal. Wall thickness was increased in a pattern of mild LVH. Systolic function was normal. The estimated ejection fraction was in the range of 60% to 65%. Wall motion was normal; there were no regional wall motion abnormalities. Doppler parameters are consistent with abnormal left ventricular relaxation (grade 1 diastolic dysfunction). - Aortic valve: Moderately calcified annulus. Bicuspid; moderately calcified leaflets. There was moderate stenosis. There was mild regurgitation. Mean gradient (S): 13 mm Hg. Peak gradient (S): 21 mm Hg. Valve area (VTI): 1.25 cm^2. - Mitral valve: Mildly thickened leaflets. There was trivial regurgitation. - Right atrium: Central venous pressure (est): 3 mm Hg. - Tricuspid valve: There was trivial regurgitation. - Pulmonary arteries: PA peak pressure: 21 mm Hg (S). - Pericardium, extracardiac: A prominent pericardial fat pad was present.  Laboratory Data:  Chemistry Recent Labs  Lab 08/14/18 1027 08/15/18 0521  NA 139 141  K 3.6 3.3*  CL 102 109  CO2 25 26  GLUCOSE 207* 78  BUN 20 14  CREATININE 0.84 0.69  CALCIUM 9.1 8.8*  GFRNONAA >60 >60  GFRAA >60 >60  ANIONGAP 12 6    No results for input(s): PROT, ALBUMIN, AST, ALT, ALKPHOS, BILITOT in the last 168  hours. Hematology Recent Labs  Lab 08/14/18 1027 08/15/18 0521  WBC 10.8* 6.8  RBC 5.47 4.78  HGB 16.8 14.6  HCT 48.5 43.1  MCV 88.7 90.2  MCH 30.7 30.5  MCHC 34.6 33.9  RDW 12.1 12.3  PLT 197 160   Cardiac Enzymes Recent Labs  Lab 08/14/18 1027 08/14/18 1302 08/14/18 1946 08/15/18 0036  TROPONINI <0.03 <0.03 <0.03 <0.03   No results for input(s): TROPIPOC in the last 168 hours.  BNPNo results for input(s): BNP, PROBNP in the last 168 hours.  DDimer No results for input(s): DDIMER in the last 168 hours.  Radiology/Studies:  Ct Chest Wo Contrast  Result Date: 08/14/2018 CLINICAL DATA:  Follow-up nodular density on recent chest x-ray EXAM: CT CHEST WITHOUT CONTRAST TECHNIQUE: Multidetector CT imaging of the chest was performed following the standard protocol without IV contrast. COMPARISON:  Plain film from earlier in the same day. FINDINGS: Cardiovascular: Somewhat limited due to lack of IV contrast. Ascending aortic aneurysmal dilatation to 4.9 cm is noted. Mild atherosclerotic calcifications are seen. Heart is not significantly enlarged. No pericardial effusion is seen. Mediastinum/Nodes: Thoracic inlet is within normal limits. No hilar or mediastinal adenopathy is noted. The esophagus as visualized is within normal limits. Lungs/Pleura: Lungs are well aerated bilaterally with mild emphysematous changes. In the right upper lobe there is a 14 mm spiculated lesion which corresponds to that seen on prior plain film examination consistent with primary pulmonary neoplasm till proven otherwise. No other parenchymal nodules are seen. No sizable effusion is noted. Upper Abdomen: No acute abnormality. Musculoskeletal: Degenerative changes of the thoracic spine are noted. IMPRESSION: 14 mm spiculated lesion in the right upper lobe consistent with primary pulmonary neoplasm till proven otherwise. Consider one of the following in 3 months for both low-risk and high-risk individuals: (a) repeat  chest CT, (b) follow-up PET-CT, or (c) tissue sampling. This recommendation follows the consensus statement: Guidelines for  Management of Incidental Pulmonary Nodules Detected on CT Images: From the Fleischner Society 2017; Radiology 2017; 284:228-243. Ascending thoracic aortic aneurysm measuring 4.9 cm. Recommend semi-annual imaging followup by CTA or MRA and referral to cardiothoracic surgery if not already obtained. This recommendation follows 2010 ACCF/AHA/AATS/ACR/ASA/SCA/SCAI/SIR/STS/SVM Guidelines for the Diagnosis and Management of Patients With Thoracic Aortic Disease. Circulation. 2010; 121: Z610-R604: E266-e369. Aortic aneurysm NOS (ICD10-I71.9) Aortic Atherosclerosis (ICD10-I70.0) and Emphysema (ICD10-J43.9). Electronically Signed   By: Alcide CleverMark  Lukens M.D.   On: 08/14/2018 17:17   Dg Chest Portable 1 View  Result Date: 08/14/2018 CLINICAL DATA:  Chest pain, shortness of breath. EXAM: PORTABLE CHEST 1 VIEW COMPARISON:  None. FINDINGS: The heart size and mediastinal contours are within normal limits. No pneumothorax or pleural effusion is noted. Left lung is clear. Right upper lobe nodular density is noted. The visualized skeletal structures are unremarkable. IMPRESSION: Right upper lobe nodular density is noted concerning for pulmonary nodule or mass. CT scan of the chest is recommended for further evaluation. Electronically Signed   By: Lupita RaiderJames  Green Jr M.D.   On: 08/14/2018 11:48    Assessment and Plan:   1. Rapid atrial fibrillation: Currently on Lopressor 25 mg twice daily.  He has been systemically anticoagulated with IV heparin.  He has a history of hypertension and diabetes giving him a CHADSVASC score of 2.  I will discontinue IV heparin and start apixaban 5 mg twice daily. I will discontinue Lopressor and start short acting diltiazem 30 mg every 6 hours.  If heart rates are adequately controlled tomorrow I will consolidate this to a long-acting form. An outpatient direct-current cardioversion could  be considered if need be after 3 weeks of uninterrupted systemic anticoagulation.  2.  Bicuspid aortic valve/aortic stenosis: Moderate in severity by echocardiogram in December 2019 as detailed above.  A follow-up echocardiogram has been ordered. We discussed the natural history and eventual need for valve replacement. Given ascending thoracic aortic aneurysm, he will need concomitant aortic root repair.  3.  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  Dr. Diona BrownerMcDowell recommended he follow-up with his PCP regarding this.  4.  Type 2 diabetes mellitus: Plan is to maintain on home regimen with sliding scale insulin and carbohydrate modified diet.  5.  Hypertension: Diastolic blood pressure presently in the low 90 range.  He has a history of hypertension dating back to his 3120s.  Blood pressures at home are in the 120-150/80-100 range.  I am discontinuing Lopressor in favor of diltiazem.  6. Ascending thoracic aortic aneurysm: 4.9 cm by chest CT. He will need referral to CT surgery in outpatient setting as he will ultimately require aortic root repair with aortic valve replacement.    For questions or updates, please contact [facility] Please consult [email or web address] for contact info under     Signed, Prentice DockerSuresh Ysidro Ramsay, MD  08/15/2018 10:03 AM

## 2018-08-15 NOTE — Progress Notes (Signed)
CBG not crossing over, CBG 64 this AM, hypoglycemic protocol ordered and followed.

## 2018-08-15 NOTE — Progress Notes (Signed)
ANTICOAGULATION CONSULT NOTE -   Pharmacy Consult for heparin Indication: atrial fibrillation  Allergies  Allergen Reactions  . Codeine   . Hydrocodone-Acetaminophen     Other reaction(s): Other (See Comments) Nervousness and itching    Patient Measurements: Height: 6' (182.9 cm) Weight: 167 lb 15.9 oz (76.2 kg) IBW/kg (Calculated) : 77.6 Heparin Dosing Weight: 76 kg  Vital Signs: Temp: 98.2 F (36.8 C) (05/10 1603) Temp Source: Oral (05/10 1603) BP: 90/76 (05/10 2245) Pulse Rate: 68 (05/10 2245)  Labs: Recent Labs    08/14/18 1027 08/14/18 1302 08/14/18 1946  HGB 16.8  --   --   HCT 48.5  --   --   PLT 197  --   --   APTT  --  29  --   LABPROT  --  13.5  --   INR  --  1.0  --   HEPARINUNFRC  --   --  0.73*  CREATININE 0.84  --   --   TROPONINI <0.03 <0.03 <0.03    Estimated Creatinine Clearance: 104.6 mL/min (by C-G formula based on SCr of 0.84 mg/dL).   Medical History: Past Medical History:  Diagnosis Date  . Atrial fibrillation (HCC)    Details unclear - patient states diagnosed in his 64s  . Bicuspid aortic valve   . Hyperlipidemia   . Sarcoidosis   . Type 2 diabetes mellitus (HCC)     Medications:  Medications Prior to Admission  Medication Sig Dispense Refill Last Dose  . acetaminophen (TYLENOL) 650 MG CR tablet Take 650 mg by mouth every 8 (eight) hours as needed for pain.   Past Month at Unknown time  . CHANTIX 1 MG tablet Take 1 tablet by mouth 2 (two) times a day.   08/14/2018 at 0800  . ibuprofen (ADVIL) 200 MG tablet Take 400-600 mg by mouth every 6 (six) hours as needed.   Past Week at Unknown time  . Insulin Glargine, 1 Unit Dial, (TOUJEO SOLOSTAR) 300 UNIT/ML SOPN Inject 60 Units into the skin at bedtime. 27 mL 0 08/13/2018 at 2100  . Insulin Lispro (HUMALOG KWIKPEN) 200 UNIT/ML SOPN Inject 5-14 Units into the skin 3 (three) times daily before meals. 5 pen 5 08/13/2018 at 2000    Assessment: Pharmacy consulted to dose heparin in patient  with atrial fibrillation.  Patient was not on anticoagulation prior to admission.  Goal of Therapy:  Heparin level 0.3-0.7 units/ml Monitor platelets by anticoagulation protocol: Yes   Plan:  Decrease heparin infusion to 1000 units/hr Check anti-Xa level in 6-8 hours and daily while on heparin. Continue to monitor H&H and platelets.   Salvatore Decent Ife Vitelli 08/15/2018,12:51 AM

## 2018-08-16 ENCOUNTER — Telehealth: Payer: Self-pay

## 2018-08-16 DIAGNOSIS — D869 Sarcoidosis, unspecified: Secondary | ICD-10-CM

## 2018-08-16 DIAGNOSIS — I712 Thoracic aortic aneurysm, without rupture, unspecified: Secondary | ICD-10-CM

## 2018-08-16 DIAGNOSIS — I350 Nonrheumatic aortic (valve) stenosis: Secondary | ICD-10-CM

## 2018-08-16 DIAGNOSIS — E119 Type 2 diabetes mellitus without complications: Secondary | ICD-10-CM

## 2018-08-16 DIAGNOSIS — Q231 Congenital insufficiency of aortic valve: Secondary | ICD-10-CM

## 2018-08-16 DIAGNOSIS — I10 Essential (primary) hypertension: Secondary | ICD-10-CM

## 2018-08-16 LAB — BASIC METABOLIC PANEL
Anion gap: 8 (ref 5–15)
BUN: 13 mg/dL (ref 6–20)
CO2: 26 mmol/L (ref 22–32)
Calcium: 8.6 mg/dL — ABNORMAL LOW (ref 8.9–10.3)
Chloride: 107 mmol/L (ref 98–111)
Creatinine, Ser: 0.82 mg/dL (ref 0.61–1.24)
GFR calc Af Amer: >60 mL/min (ref >60)
GFR calc non Af Amer: >60 mL/min (ref >60)
Glucose, Bld: 61 mg/dL — ABNORMAL LOW (ref 70–99)
Potassium: 3.6 mmol/L (ref 3.5–5.1)
Sodium: 141 mmol/L (ref 135–145)

## 2018-08-16 LAB — HIV ANTIBODY (ROUTINE TESTING W REFLEX): HIV Screen 4th Generation wRfx: NONREACTIVE

## 2018-08-16 LAB — GLUCOSE, CAPILLARY: Glucose-Capillary: 87 mg/dL (ref 70–99)

## 2018-08-16 LAB — CBC
HCT: 43.3 % (ref 39.0–52.0)
Hemoglobin: 14.1 g/dL (ref 13.0–17.0)
MCH: 30 pg (ref 26.0–34.0)
MCHC: 32.6 g/dL (ref 30.0–36.0)
MCV: 92.1 fL (ref 80.0–100.0)
Platelets: 153 10*3/uL (ref 150–400)
RBC: 4.7 MIL/uL (ref 4.22–5.81)
RDW: 12.2 % (ref 11.5–15.5)
WBC: 6.4 10*3/uL (ref 4.0–10.5)
nRBC: 0 % (ref 0.0–0.2)

## 2018-08-16 LAB — MAGNESIUM: Magnesium: 1.8 mg/dL (ref 1.7–2.4)

## 2018-08-16 MED ORDER — APIXABAN 5 MG PO TABS: 5.0000 mg | ORAL_TABLET | Freq: Two times a day (BID) | ORAL | 3 refills | Status: DC

## 2018-08-16 MED ORDER — DILTIAZEM HCL ER COATED BEADS 120 MG PO CP24: 120.0000 mg | ORAL_CAPSULE | Freq: Every day | ORAL | 11 refills | Status: DC

## 2018-08-16 NOTE — Telephone Encounter (Signed)
-----   Message from Brittany M Strader, PA-C sent at 08/16/2018  9:18 AM EDT ----- Regarding: Outpatient CT Surgery Referral This patient needs an outpatient CT Surgery referral for thoracic aortic aneurysm per Dr. Koneswaran. Patient of Dr. McDowell and I already arranged his Hospital Follow-up in a few weeks.   Thanks,  Brittany    

## 2018-08-16 NOTE — Progress Notes (Signed)
Late entry from 0407- Pt checked his own blood sugar with his machine because he felt like it was low. Pt was given orange juice and a pack of nabs. Rechecked blood sugar and it had increased to 88. Pt states he eats a lot at home and he is not eating as much here.   Will continue to monitor pt.

## 2018-08-16 NOTE — Telephone Encounter (Signed)
-----   Message from Ellsworth Lennox, [place] sent at 08/16/2018  9:18 AM EDT ----- Regarding: Outpatient CT Surgery Referral This patient needs an outpatient CT Surgery referral for thoracic aortic aneurysm per Dr. Purvis Sheffield. Patient of Dr. Diona Browner and I already arranged his Hospital Follow-up in a few weeks.   Thanks,  [place]

## 2018-08-16 NOTE — Progress Notes (Signed)
Pt rechecked his sugar with his own machine- 168. Will continue to monitor pt.

## 2018-08-16 NOTE — Discharge Summary (Signed)
Physician Discharge Summary  Jeffery Beard WUJ:[id number] DOB: 1960-11-03 DOA: 08/14/2018  PCP: Benita Stabile, MD  Admit date: 08/14/2018  Discharge date: 08/16/2018  Admitted From:Home  Disposition:  Home  Recommendations for Outpatient Follow-up:  1. Follow up with PCP in 1-2 weeks 2. Follow-up with Dr. Diona Browner as scheduled by cardiology as well as CT surgery for eventual aortic valve replacement and aortic aneurysm repair which will also be scheduled 3. Continue on Cardizem and Eliquis as prescribed 4. Will need to be scheduled for outpatient follow-up with pulmonology given lung nodule as well.  Currently recommend repeat CT chest in 3 months for reevaluation and/or PET scan. 5. Follow-up with endocrinology as scheduled  Home Health: None  Equipment/Devices: None  Discharge Condition: Stable  CODE STATUS: Full  Diet recommendation: Heart Healthy/carb modified  Brief/Interim Summary: Per HPI: Jeffery Beard a 58 y.o.malewith medical history significant forchronic diastolic heart failure, type 2 diabetes-insulin-dependent, pulmonary sarcoidosis, questionable remote history of atrial fibrillation, and bicuspid aortic valve with moderate aortic stenosis who presented to the emergency department with some worsening palpitations and dyspnea that began earlier this morning at approximately 5 AM and worsened by 7 AM. He was noted to have some weakness, but denied any chest pain, nausea, vomiting, abdominal pain, dizziness, or lightheadedness. He was noticing some worsening fatigue as he was getting some propane tanks fell this a.m. He does have heavy daily caffeine intake and no recent changes to his medications which she has been taking on a regular basis. He denies any cough, fevers, or chills.  Patient was admitted with atrial fibrillation with RVR with 2D echocardiogram still demonstrating aortic stenosis that is currently mild and LVEF greater than 65%.  Heart rates  are better controlled and patient has been weaned off Cardizem drip.  Cardiology has evaluated patient on 5/11 and recommended oral Cardizem at this time.  Heparin drip has been converted over to Eliquis as well.  CT of the chest performed with spiculated 1.4 cm mass in the right upper lobe noted which will need close follow-up.  Patient has converted to sinus rhythm overnight with good rate control in the 70 bpm range.  He will be discharged on Cardizem 120 CD to take on a daily basis as well as Eliquis twice daily.  He will need to be arranged for outpatient pulmonology follow-up regarding lung nodule as well as repeat CT chest/PET for further evaluation and monitoring.  He will be given follow-up appointments with cardiology as well as CT surgery for eventual need for aortic valve replacement and aneurysm repair.  He is otherwise doing quite well and in stable condition for discharge.  No other acute events noted during the course of his stay.  Discharge Diagnoses:  Active Problems:   Atrial fibrillation with RVR (HCC)   Bicuspid aortic valve   Nonrheumatic aortic valve stenosis   Thoracic aortic aneurysm without rupture (HCC)   Essential hypertension   Sarcoidosis   Type 2 diabetes mellitus without complication, with long-term current use of insulin (HCC)  Principal discharge diagnosis: Atrial fibrillation with RVR.  Discharge Instructions  Discharge Instructions    Diet - low sodium heart healthy   Complete by:  As directed    Increase activity slowly   Complete by:  As directed      Allergies as of 08/16/2018      Reactions   Codeine    Hydrocodone-acetaminophen    Other reaction(s): Other (See Comments) Nervousness and itching  Medication List    TAKE these medications   acetaminophen 650 MG CR tablet Commonly known as:  TYLENOL Take 650 mg by mouth every 8 (eight) hours as needed for pain.   apixaban 5 MG Tabs tablet Commonly known as:  ELIQUIS Take 1 tablet (5 mg  total) by mouth 2 (two) times daily for 30 days.   Chantix 1 MG tablet Generic drug:  varenicline Take 1 tablet by mouth 2 (two) times a day.   diltiazem 120 MG 24 hr capsule Commonly known as:  Cardizem CD Take 1 capsule (120 mg total) by mouth daily.   ibuprofen 200 MG tablet Commonly known as:  ADVIL Take 400-600 mg by mouth every 6 (six) hours as needed.   Insulin Glargine (1 Unit Dial) 300 UNIT/ML Sopn Commonly known as:  Toujeo SoloStar Inject 60 Units into the skin at bedtime.   Insulin Lispro 200 UNIT/ML Sopn Commonly known as:  HumaLOG KwikPen Inject 5-14 Units into the skin 3 (three) times daily before meals.      Follow-up Information    Jonelle Sidle, MD Follow up on 09/02/2018.   Specialty:  Cardiology Why:  VIRTUAL OFFICE VISIT with Dr. Diona Browner on 09/02/2018 at 8:40 AM. Will be a phone or video visit.  Contact information: [address] [place] [address] [phone]        Benita Stabile, MD Follow up in 1 week(s).   Specialty:  Internal Medicine Contact information: [address] [place] [address] [phone]          Allergies  Allergen Reactions  . Codeine   . Hydrocodone-Acetaminophen     Other reaction(s): Other (See Comments) Nervousness and itching    Consultations:  Cardiology   Procedures/Studies: Ct Chest Wo Contrast  Result Date: 08/14/2018 CLINICAL DATA:  Follow-up nodular density on recent chest x-ray EXAM: CT CHEST WITHOUT CONTRAST TECHNIQUE: Multidetector CT imaging of the chest was performed following the standard protocol without IV contrast. COMPARISON:  Plain film from earlier in the same day. FINDINGS: Cardiovascular: Somewhat limited due to lack of IV contrast. Ascending aortic aneurysmal dilatation to 4.9 cm is noted. Mild atherosclerotic calcifications are seen. Heart is not significantly enlarged. No pericardial effusion is seen. Mediastinum/Nodes: Thoracic inlet is within normal limits. No hilar  or mediastinal adenopathy is noted. The esophagus as visualized is within normal limits. Lungs/Pleura: Lungs are well aerated bilaterally with mild emphysematous changes. In the right upper lobe there is a 14 mm spiculated lesion which corresponds to that seen on prior plain film examination consistent with primary pulmonary neoplasm till proven otherwise. No other parenchymal nodules are seen. No sizable effusion is noted. Upper Abdomen: No acute abnormality. Musculoskeletal: Degenerative changes of the thoracic spine are noted. IMPRESSION: 14 mm spiculated lesion in the right upper lobe consistent with primary pulmonary neoplasm till proven otherwise. Consider one of the following in 3 months for both low-risk and high-risk individuals: (a) repeat chest CT, (b) follow-up PET-CT, or (c) tissue sampling. This recommendation follows the consensus statement: Guidelines for Management of Incidental Pulmonary Nodules Detected on CT Images: From the Fleischner Society 2017; Radiology 2017; 284:228-243. Ascending thoracic aortic aneurysm measuring 4.9 cm. Recommend semi-annual imaging followup by CTA or MRA and referral to cardiothoracic surgery if not already obtained. This recommendation follows 2010 ACCF/AHA/AATS/ACR/ASA/SCA/SCAI/SIR/STS/SVM Guidelines for the Diagnosis and Management of Patients With Thoracic Aortic Disease. Circulation. 2010; 121: Y774-J287. Aortic aneurysm NOS (ICD10-I71.9) Aortic Atherosclerosis (ICD10-I70.0) and Emphysema (ICD10-J43.9). Electronically Signed  By: Alcide CleverMark  Lukens M.D.   On: 08/14/2018 17:17   Dg Chest Portable 1 View  Result Date: 08/14/2018 CLINICAL DATA:  Chest pain, shortness of breath. EXAM: PORTABLE CHEST 1 VIEW COMPARISON:  None. FINDINGS: The heart size and mediastinal contours are within normal limits. No pneumothorax or pleural effusion is noted. Left lung is clear. Right upper lobe nodular density is noted. The visualized skeletal structures are unremarkable.  IMPRESSION: Right upper lobe nodular density is noted concerning for pulmonary nodule or mass. CT scan of the chest is recommended for further evaluation. Electronically Signed   By: Lupita RaiderJames  Green Jr M.D.   On: 08/14/2018 11:48     Discharge Exam: Vitals:   08/16/18 0801 08/16/18 0809  BP:    Pulse:    Resp:    Temp: 98.3 F (36.8 C)   SpO2:  97%   Vitals:   08/16/18 0700 08/16/18 0730 08/16/18 0801 08/16/18 0809  BP: 111/73     Pulse: 66 80    Resp: 17 20    Temp:   98.3 F (36.8 C)   TempSrc:   Oral   SpO2: 97% 97%  97%  Weight:      Height:        General: Pt is alert, awake, not in acute distress Cardiovascular: RRR, S1/S2 +, no rubs, no gallops Respiratory: CTA bilaterally, no wheezing, no rhonchi Abdominal: Soft, NT, ND, bowel sounds + Extremities: no edema, no cyanosis    The results of significant diagnostics from this hospitalization (including imaging, microbiology, ancillary and laboratory) are listed below for reference.     Microbiology: Recent Results (from the past 240 hour(s))  SARS Coronavirus 2 (CEPHEID - Performed in [facility] lab), Hosp Order     Status: None   Collection Time: 08/14/18 11:08 AM  Result Value Ref Range Status   SARS Coronavirus 2 NEGATIVE NEGATIVE Final    Comment: (NOTE) If result is NEGATIVE SARS-CoV-2 target nucleic acids are NOT DETECTED. The SARS-CoV-2 RNA is generally detectable in upper and lower  respiratory specimens during the acute phase of infection. The lowest  concentration of SARS-CoV-2 viral copies this assay can detect is 250  copies / mL. A negative result does not preclude SARS-CoV-2 infection  and should not be used as the sole basis for treatment or other  patient management decisions.  A negative result may occur with  improper specimen collection / handling, submission of specimen other  than nasopharyngeal swab, presence of viral mutation(s) within the  areas targeted by this assay, and  inadequate number of viral copies  (<250 copies / mL). A negative result must be combined with clinical  observations, patient history, and epidemiological information. If result is POSITIVE SARS-CoV-2 target nucleic acids are DETECTED. The SARS-CoV-2 RNA is generally detectable in upper and lower  respiratory specimens dur ing the acute phase of infection.  Positive  results are indicative of active infection with SARS-CoV-2.  Clinical  correlation with patient history and other diagnostic information is  necessary to determine patient infection status.  Positive results do  not rule out bacterial infection or co-infection with other viruses. If result is PRESUMPTIVE POSTIVE SARS-CoV-2 nucleic acids MAY BE PRESENT.   A presumptive positive result was obtained on the submitted specimen  and confirmed on repeat testing.  While 2019 novel coronavirus  (SARS-CoV-2) nucleic acids may be present in the submitted sample  additional confirmatory testing may be necessary for epidemiological  and / or clinical management  purposes  to differentiate between  SARS-CoV-2 and other Sarbecovirus currently known to infect humans.  If clinically indicated additional testing with an alternate test  methodology [phone]) is advised. The SARS-CoV-2 RNA is generally  detectable in upper and lower respiratory sp ecimens during the acute  phase of infection. The expected result is Negative. Fact Sheet for Patients:  BoilerBrush.com.cy Fact Sheet for Healthcare Providers: https://pope.com/ This test is not yet approved or cleared by the [place] FDA and has been authorized for detection and/or diagnosis of SARS-CoV-2 by FDA under an Emergency Use Authorization (EUA).  This EUA will remain in effect (meaning this test can be used) for the duration of the COVID-19 declaration under Section 564(b)(1) of the Act, 21 U.S.C. section 360bbb-3(b)(1), unless the  authorization is terminated or revoked sooner. Performed at [facility], [address], [place], [place] [address]   MRSA PCR Screening     Status: None   Collection Time: 08/14/18  2:36 PM  Result Value Ref Range Status   MRSA by PCR NEGATIVE NEGATIVE Final    Comment:        The GeneXpert MRSA Assay (FDA approved for NASAL specimens only), is one component of a comprehensive MRSA colonization surveillance program. It is not intended to diagnose MRSA infection nor to guide or monitor treatment for MRSA infections. Performed at [facility], [address] [place], [place] [address]      Labs: BNP (last 3 results) No results for input(s): BNP in the last 8760 hours. Basic Metabolic Panel: Recent Labs  Lab 08/14/18 1027 08/15/18 0521 08/16/18 0404  NA 139 141 141  K 3.6 3.3* 3.6  CL 102 109 107  CO2 GLUCOSE 207* 78 61*  BUN CREATININE 0.84 0.69 0.82  CALCIUM 9.1 8.8* 8.6*  MG  --  1.8 1.8   Liver Function Tests: No results for input(s): AST, ALT, ALKPHOS, BILITOT, PROT, ALBUMIN in the last 168 hours. No results for input(s): LIPASE, AMYLASE in the last 168 hours. No results for input(s): AMMONIA in the last 168 hours. CBC: Recent Labs  Lab 08/14/18 1027 08/15/18 0521 08/16/18 0404  WBC 10.8* 6.8 6.4  NEUTROABS 8.1*  --   --   HGB 16.8 14.6 14.1  HCT 48.5 43.1 43.3  MCV 88.7 90.2 92.1  PLT 197 160 153   Cardiac Enzymes: Recent Labs  Lab 08/14/18 1027 08/14/18 1302 08/14/18 1946 08/15/18 0036  TROPONINI <0.03 <0.03 <0.03 <0.03   BNP: Invalid input(s): POCBNP CBG: Recent Labs  Lab 08/15/18 0716 08/15/18 1146 08/15/18 1649 08/15/18 2135 08/16/18 0518  GLUCAP 109* 146* 88 178* 87   D-Dimer No results for input(s): DDIMER in the last 72 hours. Hgb A1c No results for input(s): HGBA1C in the last 72 hours. Lipid Profile No results for input(s): CHOL, HDL, LDLCALC, TRIG, CHOLHDL, LDLDIRECT in the last 72 hours. Thyroid  function studies Recent Labs    08/14/18 1027  TSH 8.147*   Anemia work up No results for input(s): VITAMINB12, FOLATE, FERRITIN, TIBC, IRON, RETICCTPCT in the last 72 hours. Urinalysis No results found for: COLORURINE, APPEARANCEUR, LABSPEC, PHURINE, GLUCOSEU, HGBUR, BILIRUBINUR, KETONESUR, PROTEINUR, UROBILINOGEN, NITRITE, LEUKOCYTESUR Sepsis Labs Invalid input(s): PROCALCITONIN,  WBC,  LACTICIDVEN Microbiology Recent Results (from the past 240 hour(s))  SARS Coronavirus 2 (CEPHEID - Performed in [facility] hospital lab), Hosp Order     Status: None   Collection Time: 08/14/18 11:08 AM  Result Value Ref Range Status  SARS Coronavirus 2 NEGATIVE NEGATIVE Final    Comment: (NOTE) If result is NEGATIVE SARS-CoV-2 target nucleic acids are NOT DETECTED. The SARS-CoV-2 RNA is generally detectable in upper and lower  respiratory specimens during the acute phase of infection. The lowest  concentration of SARS-CoV-2 viral copies this assay can detect is 250  copies / mL. A negative result does not preclude SARS-CoV-2 infection  and should not be used as the sole basis for treatment or other  patient management decisions.  A negative result may occur with  improper specimen collection / handling, submission of specimen other  than nasopharyngeal swab, presence of viral mutation(s) within the  areas targeted by this assay, and inadequate number of viral copies  (<250 copies / mL). A negative result must be combined with clinical  observations, patient history, and epidemiological information. If result is POSITIVE SARS-CoV-2 target nucleic acids are DETECTED. The SARS-CoV-2 RNA is generally detectable in upper and lower  respiratory specimens dur ing the acute phase of infection.  Positive  results are indicative of active infection with SARS-CoV-2.  Clinical  correlation with patient history and other diagnostic information is  necessary to determine patient infection status.   Positive results do  not rule out bacterial infection or co-infection with other viruses. If result is PRESUMPTIVE POSTIVE SARS-CoV-2 nucleic acids MAY BE PRESENT.   A presumptive positive result was obtained on the submitted specimen  and confirmed on repeat testing.  While 2019 novel coronavirus  (SARS-CoV-2) nucleic acids may be present in the submitted sample  additional confirmatory testing may be necessary for epidemiological  and / or clinical management purposes  to differentiate between  SARS-CoV-2 and other Sarbecovirus currently known to infect humans.  If clinically indicated additional testing with an alternate test  methodology [phone]) is advised. The SARS-CoV-2 RNA is generally  detectable in upper and lower respiratory sp ecimens during the acute  phase of infection. The expected result is Negative. Fact Sheet for Patients:  BoilerBrush.com.cy Fact Sheet for Healthcare Providers: https://pope.com/ This test is not yet approved or cleared by the [place] FDA and has been authorized for detection and/or diagnosis of SARS-CoV-2 by FDA under an Emergency Use Authorization (EUA).  This EUA will remain in effect (meaning this test can be used) for the duration of the COVID-19 declaration under Section 564(b)(1) of the Act, 21 U.S.C. section 360bbb-3(b)(1), unless the authorization is terminated or revoked sooner. Performed at [facility], [address] [place], [place] [address]   MRSA PCR Screening     Status: None   Collection Time: 08/14/18  2:36 PM  Result Value Ref Range Status   MRSA by PCR NEGATIVE NEGATIVE Final    Comment:        The GeneXpert MRSA Assay (FDA approved for NASAL specimens only), is one component of a comprehensive MRSA colonization surveillance program. It is not intended to diagnose MRSA infection nor to guide or monitor treatment for MRSA infections. Performed at [facility],  [address], [place], [place] [address]      Time coordinating discharge: 35 minutes  SIGNED:   Erick Blinks, DO [facility] 08/16/2018, 9:18 AM  If 7PM-7AM, please contact night-coverage [email or web address] Password [email or web address]

## 2018-08-16 NOTE — Progress Notes (Addendum)
Progress Note  Patient Name: Jeffery Beard Date of Encounter: 08/16/2018  Primary Cardiologist: Nona Dell, MD   Subjective   Pt had a BM and converted to sinus rhythm at 1709 on 5/11.  He is doing well today and is without complaints.  He has several questions regarding aortic aneurysm and eventual repair.  Inpatient Medications    Scheduled Meds:  apixaban  5 mg Oral BID   Chlorhexidine Gluconate Cloth  6 each Topical Daily   diltiazem  30 mg Oral Q6H   Insulin Glargine (1 Unit Dial)  60 Units Subcutaneous QHS   insulin lispro  5-14 Units Subcutaneous TID WC   sodium chloride flush  3 mL Intravenous Q12H   varenicline  1 mg Oral BID WC   Continuous Infusions:  sodium chloride Stopped (08/15/18 1039)   PRN Meds: sodium chloride, acetaminophen **OR** acetaminophen, ondansetron **OR** ondansetron (ZOFRAN) IV, sodium chloride flush   Vital Signs    Vitals:   08/16/18 0700 08/16/18 0730 08/16/18 0801 08/16/18 0809  BP: 111/73     Pulse: 66 80    Resp: 17 20    Temp:   98.3 F (36.8 C)   TempSrc:   Oral   SpO2: 97% 97%  97%  Weight:      Height:        Intake/Output Summary (Last 24 hours) at 08/16/2018 0845 Last data filed at 08/16/2018 0530 Gross per 24 hour  Intake 1106.51 ml  Output --  Net 1106.51 ml   Filed Weights   08/14/18 1429 08/15/18 0645 08/16/18 0500  Weight: 76.2 kg 76.4 kg 77.8 kg    Telemetry    Sinus rhythm- Personally Reviewed   Physical Exam   GEN: No acute distress.   Neck: No JVD Cardiac: RRR, 2/6 systolic murmur over right upper sternal border, no rubs, or gallops.  Respiratory: Clear to auscultation bilaterally. GI: Soft, nontender, non-distended  MS: No edema; No deformity. Neuro:  Nonfocal  Psych: Normal affect   Labs    Chemistry Recent Labs  Lab 08/14/18 1027 08/15/18 0521 08/16/18 0404  NA 139 141 141  K 3.6 3.3* 3.6  CL 102 109 107  CO2 25 26 26   GLUCOSE 207* 78 61*  BUN 20 14 13    CREATININE 0.84 0.69 0.82  CALCIUM 9.1 8.8* 8.6*  GFRNONAA >60 >60 >60  GFRAA >60 >60 >60  ANIONGAP 12 6 8      Hematology Recent Labs  Lab 08/14/18 1027 08/15/18 0521 08/16/18 0404  WBC 10.8* 6.8 6.4  RBC 5.47 4.78 4.70  HGB 16.8 14.6 14.1  HCT 48.5 43.1 43.3  MCV 88.7 90.2 92.1  MCH 30.7 30.5 30.0  MCHC 34.6 33.9 32.6  RDW 12.1 12.3 12.2  PLT 197 160 153    Cardiac Enzymes Recent Labs  Lab 08/14/18 1027 08/14/18 1302 08/14/18 1946 08/15/18 0036  TROPONINI <0.03 <0.03 <0.03 <0.03   No results for input(s): TROPIPOC in the last 168 hours.   BNPNo results for input(s): BNP, PROBNP in the last 168 hours.   DDimer No results for input(s): DDIMER in the last 168 hours.   Radiology    Ct Chest Wo Contrast  Result Date: 08/14/2018 CLINICAL DATA:  Follow-up nodular density on recent chest x-ray EXAM: CT CHEST WITHOUT CONTRAST TECHNIQUE: Multidetector CT imaging of the chest was performed following the standard protocol without IV contrast. COMPARISON:  Plain film from earlier in the same day. FINDINGS: Cardiovascular: Somewhat limited due to lack of IV  contrast. Ascending aortic aneurysmal dilatation to 4.9 cm is noted. Mild atherosclerotic calcifications are seen. Heart is not significantly enlarged. No pericardial effusion is seen. Mediastinum/Nodes: Thoracic inlet is within normal limits. No hilar or mediastinal adenopathy is noted. The esophagus as visualized is within normal limits. Lungs/Pleura: Lungs are well aerated bilaterally with mild emphysematous changes. In the right upper lobe there is a 14 mm spiculated lesion which corresponds to that seen on prior plain film examination consistent with primary pulmonary neoplasm till proven otherwise. No other parenchymal nodules are seen. No sizable effusion is noted. Upper Abdomen: No acute abnormality. Musculoskeletal: Degenerative changes of the thoracic spine are noted. IMPRESSION: 14 mm spiculated lesion in the right upper  lobe consistent with primary pulmonary neoplasm till proven otherwise. Consider one of the following in 3 months for both low-risk and high-risk individuals: (a) repeat chest CT, (b) follow-up PET-CT, or (c) tissue sampling. This recommendation follows the consensus statement: Guidelines for Management of Incidental Pulmonary Nodules Detected on CT Images: From the Fleischner Society 2017; Radiology 2017; 284:228-243. Ascending thoracic aortic aneurysm measuring 4.9 cm. Recommend semi-annual imaging followup by CTA or MRA and referral to cardiothoracic surgery if not already obtained. This recommendation follows 2010 ACCF/AHA/AATS/ACR/ASA/SCA/SCAI/SIR/STS/SVM Guidelines for the Diagnosis and Management of Patients With Thoracic Aortic Disease. Circulation. 2010; 121: Z610-R604. Aortic aneurysm NOS (ICD10-I71.9) Aortic Atherosclerosis (ICD10-I70.0) and Emphysema (ICD10-J43.9). Electronically Signed   By: Alcide Clever M.D.   On: 08/14/2018 17:17   Dg Chest Portable 1 View  Result Date: 08/14/2018 CLINICAL DATA:  Chest pain, shortness of breath. EXAM: PORTABLE CHEST 1 VIEW COMPARISON:  None. FINDINGS: The heart size and mediastinal contours are within normal limits. No pneumothorax or pleural effusion is noted. Left lung is clear. Right upper lobe nodular density is noted. The visualized skeletal structures are unremarkable. IMPRESSION: Right upper lobe nodular density is noted concerning for pulmonary nodule or mass. CT scan of the chest is recommended for further evaluation. Electronically Signed   By: Lupita Raider M.D.   On: 08/14/2018 11:48    Cardiac Studies   Echocardiogram 08/15/18:   1. The left ventricle has hyperdynamic systolic function, with an ejection fraction of >65%. The cavity size was normal. There is mild concentric left ventricular hypertrophy. Left ventricular diastolic function could not be evaluated secondary to  atrial fibrillation. No evidence of left ventricular regional wall  motion abnormalities.  2. The right ventricle has normal systolic function. The cavity was normal. There is no increase in right ventricular wall thickness.  3. The mitral valve is grossly normal.  4. The tricuspid valve is grossly normal.  5. The aortic valveis bicuspid. Moderate thickening of the aortic valve. Moderate calcification of the aortic valve. Aortic valve regurgitation is mild by color flow Doppler. Mild stenosis of the aortic valve.  6. The aortic root is normal in size and structure.  7. There is mild dilatation of the ascending aorta measuring 38 mm.  Patient Profile     58 y.o. male with a hx of bicuspid aortic valve stenosis and paroxysmal atrial fibrillation who is being seen today for the evaluation of rapid atrial fibrillation at the request of Dr. Sherryll Burger.  Assessment & Plan    1. Rapid atrial fibrillation: Converted to NSR on 08/15/18 at 1709. Currently on diltiazem 30 mg every 6 hours.  He systemically anticoagulated with apixaban 5 mg twice daily.  He has a history of hypertension and diabetes giving him a CHADSVASC score of 2.  I  would recommend discharging him on long-acting diltiazem 120 mg daily.  2.  Bicuspid aortic valve/aortic stenosis: Mild in severity by echocardiogram on 08/15/2018. We discussed the natural history and eventual need for valve replacement. Given ascending thoracic aortic aneurysm, he will need concomitant aortic root repair.  I will make an outpatient CT surgery referral.  3.  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  Dr. Diona BrownerMcDowell recommended he follow-up with his PCP regarding this.  4.  Type 2 diabetes mellitus: Maintain on home regimen with sliding scale insulin and carbohydrate modified diet.  5.  Hypertension: BP is elevated today.  He has a history of hypertension dating back to his 6820s.  Blood pressures at home are in the 120-150/80-100 range.  I recommend discharging him on long-acting diltiazem 120 mg  daily.  6. Ascending thoracic aortic aneurysm: 4.9 cm by chest CT. He will need referral to CT surgery in outpatient setting as he will ultimately require aortic root repair with aortic valve replacement.  I will make an outpatient referral.   [facility] will sign off.   Medication Recommendations: As above Other recommendations (labs, testing, etc): As above Follow up as an outpatient: With both Dr. Diona BrownerMcDowell and CT surgery  For questions or updates, please contact [facility] Please consult [email or web address] for contact info under Cardiology/STEMI.      Signed, Prentice DockerSuresh Reginae Wolfrey, MD  08/16/2018, 8:45 AM

## 2018-08-16 NOTE — Progress Notes (Signed)
D/c instructions reviewed with pt as well as follow up care, prescription changes, and prescription cards. Pt denies further questions at this time. Pt is A&OX4, ambulatory to d/c with this RN at side. Denies further needs or questions at this time.

## 2018-08-16 NOTE — Telephone Encounter (Signed)
Referral placed.

## 2018-08-16 NOTE — Discharge Instructions (Signed)
YOUR CARDIOLOGY TEAM HAS ARRANGED FOR AN E-VISIT FOR YOUR APPOINTMENT - PLEASE REVIEW IMPORTANT INFORMATION BELOW SEVERAL DAYS PRIOR TO YOUR APPOINTMENT ° °Due to the recent COVID-19 pandemic, we are transitioning in-person office visits to tele-medicine visits in an effort to decrease unnecessary exposure to our patients, their families, and staff. These visits are billed to your insurance just like a normal visit is. We also encourage you to sign up for MyChart if you have not already done so. You will need a smartphone if possible. For patients that do not have this, we can still complete the visit using a regular telephone but do prefer a smartphone to enable video when possible. You may have a family member that lives with you that can help. If possible, we also ask that you have a blood pressure cuff and scale at home to measure your blood pressure, heart rate and weight prior to your scheduled appointment. Patients with clinical needs that need an in-person evaluation and testing will still be able to come to the office if absolutely necessary. If you have any questions, feel free to call our office. ° ° ° °2-3 DAYS BEFORE YOUR APPOINTMENT ° °You will receive a telephone call from one of our [facility] team members - your caller ID may say "Unknown caller." If this is a video visit, we will walk you through how to get the video launched on your phone. We will remind you check your blood pressure, heart rate and weight prior to your scheduled appointment. If you have an Apple Watch or Kardia, please upload any pertinent ECG strips the day before or morning of your appointment to MyChart. Our staff will also make sure you have reviewed the consent and agree to move forward with your scheduled tele-health visit.  ° ° ° °THE DAY OF YOUR APPOINTMENT ° °Approximately 15 minutes prior to your scheduled appointment, you will receive a telephone call from one of [facility] team - your caller ID may say "Unknown caller."   Our staff will confirm medications, vital signs for the day and any symptoms you may be experiencing. Please have this information available prior to the time of visit start. It may also be helpful for you to have a pad of paper and pen handy for any instructions given during your visit. They will also walk you through joining the smartphone meeting if this is a video visit. ° ° ° °CONSENT FOR TELE-HEALTH VISIT - PLEASE REVIEW ° °I hereby voluntarily request, consent and authorize [facility] and its employed or contracted physicians, physician assistants, nurse practitioners or other licensed health care professionals (the Practitioner), to provide me with telemedicine health care services (the “Services") as deemed necessary by the treating Practitioner. I acknowledge and consent to receive the Services by the Practitioner via telemedicine. I understand that the telemedicine visit will involve communicating with the Practitioner through live audiovisual communication technology and the disclosure of certain medical information by electronic transmission. I acknowledge that I have been given the opportunity to request an in-person assessment or other available alternative prior to the telemedicine visit and am voluntarily participating in the telemedicine visit. ° °I understand that I have the right to withhold or withdraw my consent to the use of telemedicine in the course of my care at any time, without affecting my right to future care or treatment, and that the Practitioner or I may terminate the telemedicine visit at any time. I understand that I have the right to inspect all information   obtained and/or recorded in the course of the telemedicine visit and may receive copies of available information for a reasonable fee.  I understand that some of the potential risks of receiving the Services via telemedicine include:  °• Delay or interruption in medical evaluation due to technological equipment failure or  disruption; °• Information transmitted may not be sufficient (e.g. poor resolution of images) to allow for appropriate medical decision making by the Practitioner; and/or  °• In rare instances, security protocols could fail, causing a breach of personal health information. ° °Furthermore, I acknowledge that it is my responsibility to provide information about my medical history, conditions and care that is complete and accurate to the best of my ability. I acknowledge that Practitioner's advice, recommendations, and/or decision may be based on factors not within their control, such as incomplete or inaccurate data provided by me or distortions of diagnostic images or specimens that may result from electronic transmissions. I understand that the practice of medicine is not an exact science and that Practitioner makes no warranties or guarantees regarding treatment outcomes. I acknowledge that I will receive a copy of this consent concurrently upon execution via email to the email address I last provided but may also request a printed copy by calling the office of [facility].   ° °I understand that my insurance will be billed for this visit.  ° °I have read or had this consent read to me. °• I understand the contents of this consent, which adequately explains the benefits and risks of the Services being provided via telemedicine.  °• I have been provided ample opportunity to ask questions regarding this consent and the Services and have had my questions answered to my satisfaction. °• I give my informed consent for the services to be provided through the use of telemedicine in my medical care ° °By participating in this telemedicine visit I agree to the above. ° °

## 2018-08-23 ENCOUNTER — Other Ambulatory Visit: Payer: Self-pay | Admitting: "Endocrinology

## 2018-08-23 DIAGNOSIS — E139 Other specified diabetes mellitus without complications: Secondary | ICD-10-CM

## 2018-08-23 DIAGNOSIS — E1165 Type 2 diabetes mellitus with hyperglycemia: Secondary | ICD-10-CM

## 2018-08-25 ENCOUNTER — Telehealth: Payer: Self-pay | Admitting: Cardiology

## 2018-08-25 NOTE — Telephone Encounter (Signed)
Virtual Visit Pre-Appointment Phone Call  "(Name), I am calling you today to discuss your upcoming appointment. We are currently trying to limit exposure to the virus that causes COVID-19 by seeing patients at home rather than in the office."  1. "What is the BEST phone number to call the day of the visit?" - include this in appointment notes  2. Do you have or have access to (through a family member/friend) a smartphone with video capability that we can use for your visit?" a. If yes - list this number in appt notes as cell (if different from BEST phone #) and list the appointment type as a VIDEO visit in appointment notes b. If no - list the appointment type as a PHONE visit in appointment notes  3. Confirm consent - "In the setting of the current Covid19 crisis, you are scheduled for a (phone or video) visit with your provider on (date) at (time).  Just as we do with many in-office visits, in order for you to participate in this visit, we must obtain consent.  If you'd like, I can send this to your mychart (if signed up) or email for you to review.  Otherwise, I can obtain your verbal consent now.  All virtual visits are billed to your insurance company just like a normal visit would be.  By agreeing to a virtual visit, we'd like you to understand that the technology does not allow for your provider to perform an examination, and thus may limit your provider's ability to fully assess your condition. If your provider identifies any concerns that need to be evaluated in person, we will make arrangements to do so.  Finally, though the technology is pretty good, we cannot assure that it will always work on either your or our end, and in the setting of a video visit, we may have to convert it to a phone-only visit.  In either situation, we cannot ensure that we have a secure connection.  Are you willing to proceed?" STAFF: Did the patient verbally acknowledge consent to telehealth visit? Document  YES/NO here: yes 4.   5. Advise patient to be prepared - "Two hours prior to your appointment, go ahead and check your blood pressure, pulse, oxygen saturation, and your weight (if you have the equipment to check those) and write them all down. When your visit starts, your provider will ask you for this information. If you have an Apple Watch or Kardia device, please plan to have heart rate information ready on the day of your appointment. Please have a pen and paper handy nearby the day of the visit as well."  6. Give patient instructions for MyChart download to smartphone OR Doximity/Doxy.me as below if video visit (depending on what platform provider is using)  7. Inform patient they will receive a phone call 15 minutes prior to their appointment time (may be from unknown caller ID) so they should be prepared to answer    TELEPHONE CALL NOTE  Jeffery Beard has been deemed a candidate for a follow-up tele-health visit to limit community exposure during the Covid-19 pandemic. I spoke with the patient via phone to ensure availability of phone/video source, confirm preferred email & phone number, and discuss instructions and expectations.  I reminded Jeffery Kocherhillip Porto to be prepared with any vital sign and/or heart rhythm information that could potentially be obtained via home monitoring, at the time of his visit. I reminded Jeffery Kocherhillip Legendre to expect a phone call prior to his  visit.  Geraldine Contras 08/25/2018 12:54 PM   INSTRUCTIONS FOR DOWNLOADING THE MYCHART APP TO SMARTPHONE  - The patient must first make sure to have activated MyChart and know their login information - If Apple, go to [employer] and type in MyChart in the search bar and download the app. If Android, ask patient to go to [employer] and type in [place] in the search bar and download the app. The app is free but as with any other app downloads, their phone may require them to verify saved payment information or  Apple/Android password.  - The patient will need to then log into the app with their MyChart username and password, and select [facility] as their healthcare provider to link the account. When it is time for your visit, go to the MyChart app, find appointments, and click Begin Video Visit. Be sure to Select Allow for your device to access the Microphone and Camera for your visit. You will then be connected, and your provider will be with you shortly.  **If they have any issues connecting, or need assistance please contact MyChart service desk [phone])**  **If using a computer, in order to ensure the best quality for their visit they will need to use either of the following Internet Browsers: [employer] or Google Chrome**  IF USING DOXIMITY or DOXY.ME - The patient will receive a link just prior to their visit by text.     FULL LENGTH CONSENT FOR TELE-HEALTH VISIT   I hereby voluntarily request, consent and authorize [facility] and its employed or contracted physicians, physician assistants, nurse practitioners or other licensed health care professionals (the Practitioner), to provide me with telemedicine health care services (the Services") as deemed necessary by the treating Practitioner. I acknowledge and consent to receive the Services by the Practitioner via telemedicine. I understand that the telemedicine visit will involve communicating with the Practitioner through live audiovisual communication technology and the disclosure of certain medical information by electronic transmission. I acknowledge that I have been given the opportunity to request an in-person assessment or other available alternative prior to the telemedicine visit and am voluntarily participating in the telemedicine visit.  I understand that I have the right to withhold or withdraw my consent to the use of telemedicine in the course of my care at any time, without affecting my right to future care  or treatment, and that the Practitioner or I may terminate the telemedicine visit at any time. I understand that I have the right to inspect all information obtained and/or recorded in the course of the telemedicine visit and may receive copies of available information for a reasonable fee.  I understand that some of the potential risks of receiving the Services via telemedicine include:   Delay or interruption in medical evaluation due to technological equipment failure or disruption;  Information transmitted may not be sufficient (e.g. poor resolution of images) to allow for appropriate medical decision making by the Practitioner; and/or   In rare instances, security protocols could fail, causing a breach of personal health information.  Furthermore, I acknowledge that it is my responsibility to provide information about my medical history, conditions and care that is complete and accurate to the best of my ability. I acknowledge that Practitioner's advice, recommendations, and/or decision may be based on factors not within their control, such as incomplete or inaccurate data provided by me or distortions of diagnostic images or specimens that may result from electronic transmissions. I understand  that the practice of medicine is not an exact science and that Practitioner makes no warranties or guarantees regarding treatment outcomes. I acknowledge that I will receive a copy of this consent concurrently upon execution via email to the email address I last provided but may also request a printed copy by calling the office of [facility].    I understand that my insurance will be billed for this visit.   I have read or had this consent read to me.  I understand the contents of this consent, which adequately explains the benefits and risks of the Services being provided via telemedicine.   I have been provided ample opportunity to ask questions regarding this consent and the Services and have had  my questions answered to my satisfaction.  I give my informed consent for the services to be provided through the use of telemedicine in my medical care  By participating in this telemedicine visit I agree to the above.

## 2018-09-01 NOTE — Progress Notes (Signed)
Virtual Visit via Telephone Note   This visit type was conducted due to national recommendations for restrictions regarding the COVID-19 Pandemic (e.g. social distancing) in an effort to limit this patient's exposure and mitigate transmission in our community.  Due to his co-morbid illnesses, this patient is at least at moderate risk for complications without adequate follow up.  This format is felt to be most appropriate for this patient at this time.  The patient did not have access to video technology/had technical difficulties with video requiring transitioning to audio format only (telephone).  All issues noted in this document were discussed and addressed.  No physical exam could be performed with this format.  Please refer to the patient's chart for his  consent to telehealth for [facility].   Date:  09/02/2018   ID:  Jeffery Beard, DOB Sep 30, 1960, MRN [id number]  Patient Location: Home Provider Location: Home  PCP:  Jeffery Squibb, MD  Cardiologist:  Jeffery Lesches, MD Electrophysiologist:  None   Evaluation Performed:  Follow-Up Visit  Chief Complaint:  Cardiac follow-up  History of Present Illness:    Jeffery Beard is a 58 y.o. male last seen in January.  I reviewed extensive interval records and updated the chart. He was admitted to the hospital recently in May and seen in consultation by Dr. Bronson Beard.  It is described that he had rapid atrial fibrillation which spontaneously converted to sinus rhythm, my review of the ECG shows atrial flutter. He was treated with Eliquis for stroke prophylaxis and Cardizem CD.  He underwent further imaging studies including a repeat echocardiogram and also chest CT.  He has a known bicuspid aortic valve with mild to moderate stenosis and was noted to have an ascending thoracic aortic aneurysm measuring 4.9 cm.  Referral was made by Dr. Bronson Beard for consultation with [facility] as an outpatient.  I also note that he had a 14 mm spiculated  lesion in the right upper lobe concerning for neoplasm.  Discharge summary indicates outpatient follow-up with Pulmonary.  He did not have video access and I spoke with him by phone today.  He states that he feels better in general since discharge, still gets short of breath when he "overdoes it."  He works as a Presenter, broadcasting.  He has been limiting his lifting to less than 50 pounds.  Today I had an extensive discussion with him regarding his recent imaging studies, finding of an ascending thoracic aortic aneurysm in association with his known bicuspid aortic valve that is mildly to moderately stenotic based on all studies, atrial fibrillation and flutter, also incidental documentation of a right upper lobe lesion by CT. He also has poorly controlled diabetes - he states that his recent hemoglobin A1c was 10.2 (could not find in chart).  He states that he sees Jeffery Beard for endocrinology follow-up.  Jeffery Beard talks very analytically at times and tells me that he reads a lot about his diagnoses, although on the other hand he had really not had any regular follow-up for known chronic medical conditions for several years - I just met him in December 2019.  I reinforced with him the fact that he needs to follow through with the consultation made with Jeffery Beard on June 5, prepared him that he will likely need further studies such as PET imaging of the chest.  He reports a history of sarcoidosis proven by lung biopsy in the past, but has had no regular follow-up for this either.  I  went over his medications.  He states that he has been having back pain, reports a known history of disc disease, but also was concerned whether it could have been a side effect of Eliquis.  He does not report any spontaneous bleeding problems.  Does not report active palpitations at this time.  The patient does not have symptoms concerning for COVID-19 infection (fever, chills, cough, or new shortness of breath).    Past  Medical History:  Diagnosis Date  . Atrial fibrillation ([place])    Details unclear - patient states diagnosed in his 28s  . Bicuspid aortic valve   . Hyperlipidemia   . Sarcoidosis   . Type 2 diabetes mellitus ([place])    Past Surgical History:  Procedure Laterality Date  . LUNG BIOPSY    . Nasal septum surgery    . PALATE / UVULA BIOPSY / EXCISION    . TONSILLECTOMY    . VASECTOMY       Current Meds  Medication Sig  . acetaminophen (TYLENOL) 650 MG CR tablet Take 650 mg by mouth every 8 (eight) hours as needed for pain.  Marland Kitchen apixaban (ELIQUIS) 5 MG TABS tablet Take 1 tablet (5 mg total) by mouth 2 (two) times daily for 30 days.  . CHANTIX 1 MG tablet Take 1 tablet by mouth 2 (two) times a day.  . diltiazem (CARDIZEM CD) 120 MG 24 hr capsule Take 1 capsule (120 mg total) by mouth daily.  Marland Kitchen ibuprofen (ADVIL) 200 MG tablet Take 400-600 mg by mouth every 6 (six) hours as needed.  . Insulin Glargine, 1 Unit Dial, (TOUJEO SOLOSTAR) 300 UNIT/ML SOPN Inject 60 Units into the skin at bedtime.  . Insulin Lispro (HUMALOG KWIKPEN) 200 UNIT/ML SOPN Inject 5-14 Units into the skin 3 (three) times daily before meals.     Allergies:   Codeine and Hydrocodone-acetaminophen   Social History   Tobacco Use  . Smoking status: Current Every Day Smoker    Packs/day: 1.00    Years: 25.00    Pack years: 25.00    Types: Cigarettes  . Smokeless tobacco: Never Used  Substance Use Topics  . Alcohol use: Never    Frequency: Never  . Drug use: Never     Family Hx: The patient's family history includes COPD in his mother; Cancer - Prostate in his father; High blood pressure in his father and mother.  ROS:   Please see the history of present illness.    All other systems reviewed and are negative.   Prior CV studies:   The following studies were reviewed today:  Echocardiogram 03/17/2018: Study Conclusions  - Left ventricle: The cavity size was normal. Wall thickness was increased in a  pattern of mild LVH. Systolic function was normal. The estimated ejection fraction was in the range of 60% to 65%. Wall motion was normal; there were no regional wall motion abnormalities. Doppler parameters are consistent with abnormal left ventricular relaxation (grade 1 diastolic dysfunction). - Aortic valve: Moderately calcified annulus. Bicuspid; moderately calcified leaflets. There was moderate stenosis. There was mild regurgitation. Mean gradient (S): 13 mm Hg. Peak gradient (S): 21 mm Hg. Valve area (VTI): 1.25 cm^2. - Mitral valve: Mildly thickened leaflets. There was trivial regurgitation. - Right atrium: Central venous pressure (est): 3 mm Hg. - Tricuspid valve: There was trivial regurgitation. - Pulmonary arteries: PA peak pressure: 21 mm Hg (S). - Pericardium, extracardiac: A prominent pericardial fat pad was present.  Chest CT 08/14/2018: IMPRESSION: 14 mm spiculated  lesion in the right upper lobe consistent with primary pulmonary neoplasm till proven otherwise. Consider one of the following in 3 months for both low-risk and high-risk individuals: (a) repeat chest CT, (b) follow-up PET-CT, or (c) tissue sampling. This recommendation follows the consensus statement: Guidelines for Management of Incidental Pulmonary Nodules Detected on CT Images: From the Fleischner Society 2017; Radiology 2017; 284:228-243.  Ascending thoracic aortic aneurysm measuring 4.9 cm. Recommend semi-annual imaging followup by CTA or MRA and referral to cardiothoracic surgery if not already obtained. This recommendation follows 2010 ACCF/AHA/AATS/ACR/ASA/SCA/SCAI/SIR/STS/SVM Guidelines for the Diagnosis and Management of Patients With Thoracic Aortic Disease. Circulation. 2010; 121: Y694-W546. Aortic aneurysm NOS (ICD10-I71.9)  Aortic Atherosclerosis (ICD10-I70.0) and Emphysema (ICD10-J43.9).  Echocardiogram 08/15/2018:  1. The left ventricle has hyperdynamic systolic  function, with an ejection fraction of >65%. The cavity size was normal. There is mild concentric left ventricular hypertrophy. Left ventricular diastolic function could not be evaluated secondary to  atrial fibrillation. No evidence of left ventricular regional wall motion abnormalities.  2. The right ventricle has normal systolic function. The cavity was normal. There is no increase in right ventricular wall thickness.  3. The mitral valve is grossly normal.  4. The tricuspid valve is grossly normal.  5. The aortic valveis bicuspid. Moderate thickening of the aortic valve. Moderate calcification of the aortic valve. Aortic valve regurgitation is mild by color flow Doppler. Mild stenosis of the aortic valve.  6. The aortic root is normal in size and structure.  7. There is mild dilatation of the ascending aorta measuring 38 mm.  Labs/Other Tests and Data Reviewed:    EKG:  An ECG dated 08/14/2018 was personally reviewed today and demonstrated:  Probable atrial flutter with 2:1  block and LVH.  Recent Labs: 06/20/2018: ALT 11 08/14/2018: TSH 8.147 08/16/2018: BUN 13; Creatinine, Ser 0.82; Hemoglobin 14.1; Magnesium 1.8; Platelets 153; Potassium 3.6; Sodium 141   Recent Lipid Panel Lab Results  Component Value Date/Time   CHOL 141 01/22/2018   TRIG 89 01/22/2018   HDL 49 01/22/2018   LDLCALC 79 01/22/2018    Wt Readings from Last 3 Encounters:  09/02/18 172 lb (78 kg)  08/16/18 171 lb 8.3 oz (77.8 kg)  05/06/18 172 lb (78 kg)     Objective:    Vital Signs:  Ht 6' (1.829 m)   Wt 172 lb (78 kg)   BMI 23.33 kg/m    Patient did not have a way to check vital signs today. Patient spoke in full sentences, not short of breath. No audible wheezing or coughing. Speech pattern normal.  ASSESSMENT & PLAN:    1.  Bicuspid aortic valve with mild to moderate aortic stenosis overall based on last 2 echocardiograms.  2.  Ascending thoracic aortic aneurysm measuring 4.9 cm by recent chest  CTA.  Measurements of the aortic root by echocardiography were closer to normal to mildly dilated.  He does not have a prior chest CTA for comparison.  I discussed known association of bicuspid aortic valve with thoracic aortic aneurysm with the patient today and reinforced plan for consultation with Jeffery Beard on June 5th.  3.  Incidentally noted 14 mm spiculated lesion in the right upper lobe by chest CT concerning for neoplasm.  Patient states that he has a prior biopsy proven diagnosis of sarcoidosis, but has had no regular follow-up for this in several years.  He will be seeing Jeffery Beard soon for concurrent evaluation of ascending thoracic aortic aneurysm.  Anticipate  he will need a PET scan as part of his ongoing evaluation - not clear that this was arranged by hospitalist at his discharge or whether he has any Pulmonary follow-up arranged.  4.  History of reported paroxysmal atrial fibrillation, recently re-documented with ECG showing atrial flutter by my review.  He spontaneously reverted to sinus rhythm with rate control and is now on Eliquis and Cardizem CD.  5.  Poorly controlled type 2 diabetes mellitus, patient reports recent hemoglobin A1c of 10.2%.  He states that he follows with Jeffery Beard for endocrinology management.  COVID-19 Education: The signs and symptoms of COVID-19 were discussed with the patient and how to seek care for testing (follow up with PCP or arrange E-visit).  The importance of social distancing was discussed today.  Time:   Today, I have spent 25 minutes with the patient with telehealth technology discussing the above problems.     Medication Adjustments/Labs and Tests Ordered: Current medicines are reviewed at length with the patient today.  Concerns regarding medicines are outlined above.   Tests Ordered: No orders of the defined types were placed in this encounter.   Medication Changes: No orders of the defined types were placed in this encounter.    Disposition:  Follow up 1 month in the [place] office.  Signed, Jeffery Lesches, MD  09/02/2018 10:04 AM    [place]

## 2018-09-02 ENCOUNTER — Telehealth (INDEPENDENT_AMBULATORY_CARE_PROVIDER_SITE_OTHER): Payer: Managed Care, Other (non HMO) | Admitting: Cardiology

## 2018-09-02 ENCOUNTER — Other Ambulatory Visit: Payer: Self-pay

## 2018-09-02 ENCOUNTER — Encounter: Payer: Self-pay | Admitting: Cardiology

## 2018-09-02 VITALS — Ht 72.0 in | Wt 172.0 lb

## 2018-09-02 DIAGNOSIS — I712 Thoracic aortic aneurysm, without rupture, unspecified: Secondary | ICD-10-CM

## 2018-09-02 DIAGNOSIS — Z7189 Other specified counseling: Secondary | ICD-10-CM | POA: Diagnosis not present

## 2018-09-02 DIAGNOSIS — I480 Paroxysmal atrial fibrillation: Secondary | ICD-10-CM

## 2018-09-02 DIAGNOSIS — I350 Nonrheumatic aortic (valve) stenosis: Secondary | ICD-10-CM

## 2018-09-02 DIAGNOSIS — I4892 Unspecified atrial flutter: Secondary | ICD-10-CM

## 2018-09-02 DIAGNOSIS — Q231 Congenital insufficiency of aortic valve: Secondary | ICD-10-CM | POA: Diagnosis not present

## 2018-09-02 DIAGNOSIS — E1165 Type 2 diabetes mellitus with hyperglycemia: Secondary | ICD-10-CM

## 2018-09-02 DIAGNOSIS — I4891 Unspecified atrial fibrillation: Secondary | ICD-10-CM

## 2018-09-02 DIAGNOSIS — Z862 Personal history of diseases of the blood and blood-forming organs and certain disorders involving the immune mechanism: Secondary | ICD-10-CM

## 2018-09-02 NOTE — Patient Instructions (Signed)
Medication Instructions: Your physician recommends that you continue on your current medications as directed. Please refer to the Current Medication list given to you today.   Labwork: None  Procedures/Testing: None  Follow-Up: 1 month in [place] with Dr.McDowell  Any Additional Special Instructions Will Be Listed Below (If Applicable).     If you need a refill on your cardiac medications before your next appointment, please call your pharmacy.      Thank you for choosing [facility] !

## 2018-09-05 LAB — HEMOGLOBIN A1C: Hemoglobin A1C: 11.2

## 2018-09-06 ENCOUNTER — Other Ambulatory Visit: Payer: Self-pay

## 2018-09-06 ENCOUNTER — Ambulatory Visit (INDEPENDENT_AMBULATORY_CARE_PROVIDER_SITE_OTHER): Payer: Managed Care, Other (non HMO) | Admitting: "Endocrinology

## 2018-09-06 ENCOUNTER — Encounter: Payer: Self-pay | Admitting: "Endocrinology

## 2018-09-06 VITALS — BP 138/89 | HR 68 | Temp 97.6°F | Ht 73.0 in | Wt 174.0 lb

## 2018-09-06 DIAGNOSIS — E139 Other specified diabetes mellitus without complications: Secondary | ICD-10-CM | POA: Diagnosis not present

## 2018-09-06 DIAGNOSIS — E039 Hypothyroidism, unspecified: Secondary | ICD-10-CM | POA: Insufficient documentation

## 2018-09-06 DIAGNOSIS — E782 Mixed hyperlipidemia: Secondary | ICD-10-CM | POA: Diagnosis not present

## 2018-09-06 MED ORDER — LEVOTHYROXINE SODIUM 25 MCG PO TABS: 25.0000 ug | ORAL_TABLET | Freq: Every day | ORAL | 6 refills | Status: DC

## 2018-09-06 MED ORDER — GLUCOSE BLOOD VI STRP: ORAL_STRIP | 2 refills | Status: DC

## 2018-09-06 MED ORDER — INSULIN LISPRO 200 UNIT/ML ~~LOC~~ SOPN: 8.0000 [IU] | PEN_INJECTOR | Freq: Three times a day (TID) | SUBCUTANEOUS | 5 refills | Status: DC

## 2018-09-06 NOTE — Progress Notes (Signed)
Endocrinology follow-up note       09/06/2018, 4:51 PM   Subjective:    Patient ID: Jeffery Beard, male    DOB: 1960-06-02.  Jeffery Beard is being seen in follow-up for management of currently uncontrolled symptomatic diabetes requested by  Benita Stabile, MD.   Past Medical History:  Diagnosis Date  . Atrial fibrillation (HCC)    Details unclear - patient states diagnosed in his 45s  . Bicuspid aortic valve   . Hyperlipidemia   . Sarcoidosis   . Type 2 diabetes mellitus (HCC)    Past Surgical History:  Procedure Laterality Date  . LUNG BIOPSY    . Nasal septum surgery    . PALATE / UVULA BIOPSY / EXCISION    . TONSILLECTOMY    . VASECTOMY     Social History   Socioeconomic History  . Marital status: Married    Spouse name: Not on file  . Number of children: Not on file  . Years of education: Not on file  . Highest education level: Not on file  Occupational History  . Not on file  Social Needs  . Financial resource strain: Not on file  . Food insecurity:    Worry: Not on file    Inability: Not on file  . Transportation needs:    Medical: Not on file    Non-medical: Not on file  Tobacco Use  . Smoking status: Current Every Day Smoker    Packs/day: 1.00    Years: 25.00    Pack years: 25.00    Types: Cigarettes  . Smokeless tobacco: Never Used  Substance and Sexual Activity  . Alcohol use: Never    Frequency: Never  . Drug use: Never  . Sexual activity: Not on file  Lifestyle  . Physical activity:    Days per week: Not on file    Minutes per session: Not on file  . Stress: Not on file  Relationships  . Social connections:    Talks on phone: Not on file    Gets together: Not on file    Attends religious service: Not on file    Active member of club or organization: Not on file    Attends meetings of clubs or organizations: Not on file    Relationship status: Not on  file  Other Topics Concern  . Not on file  Social History Narrative  . Not on file   Outpatient Encounter Medications as of 09/06/2018  Medication Sig  . acetaminophen (TYLENOL) 650 MG CR tablet Take 650 mg by mouth every 8 (eight) hours as needed for pain.  Marland Kitchen apixaban (ELIQUIS) 5 MG TABS tablet Take 1 tablet (5 mg total) by mouth 2 (two) times daily for 30 days.  . CHANTIX 1 MG tablet Take 1 tablet by mouth 2 (two) times a day.  . diltiazem (CARDIZEM CD) 120 MG 24 hr capsule Take 1 capsule (120 mg total) by mouth daily.  Marland Kitchen glucose blood (ONETOUCH VERIO) test strip Use as instructed  . ibuprofen (ADVIL) 200 MG tablet Take 400-600 mg by mouth every 6 (six) hours as needed.  Marland Kitchen  Insulin Glargine, 1 Unit Dial, (TOUJEO SOLOSTAR) 300 UNIT/ML SOPN Inject 60 Units into the skin at bedtime.  . Insulin Lispro (HUMALOG KWIKPEN) 200 UNIT/ML SOPN Inject 5-14 Units into the skin 3 (three) times daily before meals.   No facility-administered encounter medications on file as of 09/06/2018.     ALLERGIES: Allergies  Allergen Reactions  . Codeine   . Hydrocodone-Acetaminophen     Other reaction(s): Other (See Comments) Nervousness and itching    VACCINATION STATUS:  There is no immunization history on file for this patient.  Diabetes  He presents for his follow-up diabetic visit. He has type 2 diabetes mellitus. Onset time: He was diagnosed at approximate age of 50 years. His disease course has been worsening. There are no hypoglycemic associated symptoms. Pertinent negatives for hypoglycemia include no confusion, headaches, pallor or seizures. Associated symptoms include polydipsia and polyuria. Pertinent negatives for diabetes include no chest pain, no fatigue, no polyphagia and no weakness. There are no hypoglycemic complications. Symptoms are worsening. Risk factors for coronary artery disease include diabetes mellitus, dyslipidemia, male sex, tobacco exposure and sedentary lifestyle. Current diabetic  treatment includes insulin injections (He is currently on Toujeo 40 units twice daily, Humalog 15 units 3 times daily AC.). His weight is increasing steadily. He is following a generally unhealthy diet. When asked about meal planning, he reported none. He has not had a previous visit with a dietitian. He rarely participates in exercise. His home blood glucose trend is fluctuating minimally. His overall blood glucose range is >200 mg/dl. (Patient returns with a log showing incomplete monitoring, 0-1 average testing per day.  His recent A1c is 11.2% increasing from 10.8%.  Patient is being considered for cardiac surgery for valvular repair. ) An ACE inhibitor/angiotensin II receptor blocker is not being taken.  Hyperlipidemia  This is a chronic problem. The current episode started more than 1 year ago. The problem is controlled. Exacerbating diseases include diabetes. Pertinent negatives include no chest pain, myalgias or shortness of breath. Risk factors for coronary artery disease include diabetes mellitus, dyslipidemia, male sex and a sedentary lifestyle.    Review of Systems  Constitutional: Negative for chills, fatigue, fever and unexpected weight change.  HENT: Negative for dental problem, mouth sores and trouble swallowing.   Eyes: Negative for visual disturbance.  Respiratory: Negative for cough, choking, chest tightness, shortness of breath and wheezing.   Cardiovascular: Negative for chest pain, palpitations and leg swelling.  Gastrointestinal: Negative for abdominal distention, abdominal pain, constipation, diarrhea, nausea and vomiting.  Endocrine: Positive for polydipsia and polyuria. Negative for polyphagia.  Genitourinary: Negative for dysuria, flank pain, hematuria and urgency.  Musculoskeletal: Negative for back pain, gait problem, myalgias and neck pain.  Skin: Negative for pallor, rash and wound.  Neurological: Negative for seizures, syncope, weakness, numbness and headaches.   Psychiatric/Behavioral: Negative for confusion and dysphoric mood.    Objective:    BP 138/89   Pulse 68   Temp 97.6 F (36.4 C)   Ht  (1.854 m)   Wt 174 lb (78.9 kg)   SpO2 96%   BMI 22.96 kg/m   Wt Readings from Last 3 Encounters:  09/06/18 174 lb (78.9 kg)  09/02/18 172 lb (78 kg)  08/16/18 171 lb 8.3 oz (77.8 kg)     Physical Exam Constitutional:      General: He is not in acute distress.    Appearance: He is well-developed.  HENT:     Head: Normocephalic and atraumatic.  Neck:  Musculoskeletal: Normal range of motion and neck supple.     Thyroid: No thyromegaly.     Trachea: No tracheal deviation.  Cardiovascular:     Rate and Rhythm: Normal rate.     Pulses:          Dorsalis pedis pulses are 1+ on the right side and 1+ on the left side.       Posterior tibial pulses are 1+ on the right side and 1+ on the left side.     Heart sounds: Normal heart sounds, S1 normal and S2 normal. No murmur. No gallop.   Pulmonary:     Effort: No respiratory distress.     Breath sounds: Normal breath sounds. No wheezing.  Abdominal:     General: Bowel sounds are normal. There is no distension.     Palpations: Abdomen is soft.     Tenderness: There is no abdominal tenderness. There is no guarding.  Musculoskeletal:        General: No swelling, deformity or signs of injury.     Right shoulder: He exhibits no swelling and no deformity.     Right lower leg: No edema.     Left lower leg: No edema.     Comments: His foot exam is negative for neuropathy nor PAD.  Skin:    General: Skin is warm and dry.     Findings: No rash.     Nails: There is no clubbing.   Neurological:     Mental Status: He is alert and oriented to person, place, and time.     Cranial Nerves: No cranial nerve deficit.     Sensory: No sensory deficit.     Gait: Gait normal.     Deep Tendon Reflexes: Reflexes are normal and symmetric.  Psychiatric:        Speech: Speech normal.        Behavior:  Behavior normal. Behavior is cooperative.        Judgment: Judgment normal.    Recent Results (from the past 2160 hour(s))  Hemoglobin A1c     Status: Abnormal   Collection Time: 06/20/18  4:26 PM  Result Value Ref Range   Hgb A1c MFr Bld 10.8 (H) 4.8 - 5.6 %    Comment:          Prediabetes: 5.7 - 6.4          Diabetes: >6.4          Glycemic control for adults with diabetes: <7.0    Est. average glucose Bld gHb Est-mCnc 263 mg/dL  Anti-islet cell antibody     Status: None   Collection Time: 06/20/18  4:26 PM  Result Value Ref Range   Islet Cell Ab Negative Neg:<1:1  Glutamic acid decarboxylase auto abs     Status: Abnormal   Collection Time: 06/20/18  4:26 PM  Result Value Ref Range   Glutamic Acid Decarb Ab 15.3 (H) 0.0 - 5.0 U/mL  Comprehensive metabolic panel     Status: Abnormal   Collection Time: 06/20/18  4:26 PM  Result Value Ref Range   Glucose 113 (H) 65 - 99 mg/dL   BUN 9 6 - 24 mg/dL   Creatinine, Ser 1.61 0.76 - 1.27 mg/dL   GFR calc non Af Amer 86 >59 mL/min/1.73   GFR calc Af Amer 100 >59 mL/min/1.73   BUN/Creatinine Ratio 9 9 - 20   Sodium 143 134 - 144 mmol/L   Potassium 4.2 3.5 - 5.2 mmol/L  Chloride 104 96 - 106 mmol/L   CO2 25 20 - 29 mmol/L   Calcium 9.4 8.7 - 10.2 mg/dL   Total Protein 6.8 6.0 - 8.5 g/dL   Albumin 4.5 3.8 - 4.9 g/dL   Globulin, Total 2.3 1.5 - 4.5 g/dL   Albumin/Globulin Ratio 2.0 1.2 - 2.2   Bilirubin Total 0.5 0.0 - 1.2 mg/dL   Alkaline Phosphatase 53 39 - 117 IU/L   AST 13 0 - 40 IU/L   ALT 11 0 - 44 IU/L  CBC with Differential     Status: Abnormal   Collection Time: 08/14/18 10:27 AM  Result Value Ref Range   WBC 10.8 (H) 4.0 - 10.5 K/uL   RBC 5.47 4.22 - 5.81 MIL/uL   Hemoglobin 16.8 13.0 - 17.0 g/dL   HCT 90.2 11.1 - 55.2 %   MCV 88.7 80.0 - 100.0 fL   MCH 30.7 26.0 - 34.0 pg   MCHC 34.6 30.0 - 36.0 g/dL   RDW 08.0 22.3 - 36.1 %   Platelets 197 150 - 400 K/uL   nRBC 0.0 0.0 - 0.2 %   Neutrophils Relative % 75 %    Neutro Abs 8.1 (H) 1.7 - 7.7 K/uL   Lymphocytes Relative 16 %   Lymphs Abs 1.8 0.7 - 4.0 K/uL   Monocytes Relative 7 %   Monocytes Absolute 0.7 0.1 - 1.0 K/uL   Eosinophils Relative 1 %   Eosinophils Absolute 0.1 0.0 - 0.5 K/uL   Basophils Relative 1 %   Basophils Absolute 0.1 0.0 - 0.1 K/uL   Immature Granulocytes 0 %   Abs Immature Granulocytes 0.02 0.00 - 0.07 K/uL    Comment: Performed at [facility], [address] [place], [place] [address]  Basic metabolic panel     Status: Abnormal   Collection Time: 08/14/18 10:27 AM  Result Value Ref Range   Sodium 139 135 - 145 mmol/L   Potassium 3.6 3.5 - 5.1 mmol/L   Chloride 102 98 - 111 mmol/L   CO2 25 22 - 32 mmol/L   Glucose, Bld 207 (H) 70 - 99 mg/dL   BUN 20 6 - 20 mg/dL   Creatinine, Ser 7.53 0.61 - 1.24 mg/dL   Calcium 9.1 8.9 - 00.5 mg/dL   GFR calc non Af Amer >60 >60 mL/min   GFR calc Af Amer >60 >60 mL/min   Anion gap 12 5 - 15    Comment: Performed at [facility], [address] [place], [place] [address]  Troponin I - ONCE - STAT     Status: None   Collection Time: 08/14/18 10:27 AM  Result Value Ref Range   Troponin I <0.03 <0.03 ng/mL    Comment: Performed at [facility], [address], [place], [place] [address]  TSH     Status: Abnormal   Collection Time: 08/14/18 10:27 AM  Result Value Ref Range   TSH 8.147 (H) 0.350 - 4.500 uIU/mL    Comment: Performed by a 3rd Generation assay with a functional sensitivity of <=0.01 uIU/mL. Performed at [facility], [address], [place], [place] [address]   SARS Coronavirus 2 (CEPHEID - Performed in [facility] hospital lab), Hosp Order     Status: None   Collection Time: 08/14/18 11:08 AM  Result Value Ref Range   SARS Coronavirus 2 NEGATIVE NEGATIVE    Comment: (NOTE) If result is NEGATIVE SARS-CoV-2 target nucleic acids are NOT DETECTED. The SARS-CoV-2 RNA is generally detectable in  upper and lower  respiratory specimens during the acute phase of infection. The  lowest  concentration of SARS-CoV-2 viral copies this assay can detect is 250  copies / mL. A negative result does not preclude SARS-CoV-2 infection  and should not be used as the sole basis for treatment or other  patient management decisions.  A negative result may occur with  improper specimen collection / handling, submission of specimen other  than nasopharyngeal swab, presence of viral mutation(s) within the  areas targeted by this assay, and inadequate number of viral copies  (<250 copies / mL). A negative result must be combined with clinical  observations, patient history, and epidemiological information. If result is POSITIVE SARS-CoV-2 target nucleic acids are DETECTED. The SARS-CoV-2 RNA is generally detectable in upper and lower  respiratory specimens dur ing the acute phase of infection.  Positive  results are indicative of active infection with SARS-CoV-2.  Clinical  correlation with patient history and other diagnostic information is  necessary to determine patient infection status.  Positive results do  not rule out bacterial infection or co-infection with other viruses. If result is PRESUMPTIVE POSTIVE SARS-CoV-2 nucleic acids MAY BE PRESENT.   A presumptive positive result was obtained on the submitted specimen  and confirmed on repeat testing.  While 2019 novel coronavirus  (SARS-CoV-2) nucleic acids may be present in the submitted sample  additional confirmatory testing may be necessary for epidemiological  and / or clinical management purposes  to differentiate between  SARS-CoV-2 and other Sarbecovirus currently known to infect humans.  If clinically indicated additional testing with an alternate test  methodology [id number]) is advised. The SARS-CoV-2 RNA is generally  detectable in upper and lower respiratory sp ecimens during the acute  phase of infection. The expected result is Negative. Fact Sheet for Patients:   BoilerBrush.com.cy Fact Sheet for Healthcare Providers: https://pope.com/ This test is not yet approved or cleared by the [place] FDA and has been authorized for detection and/or diagnosis of SARS-CoV-2 by FDA under an Emergency Use Authorization (EUA).  This EUA will remain in effect (meaning this test can be used) for the duration of the COVID-19 declaration under Section 564(b)(1) of the Act, 21 U.S.C. section 360bbb-3(b)(1), unless the authorization is terminated or revoked sooner. Performed at [facility], [address] [place], [place] [address]   POC CBG, ED     Status: Abnormal   Collection Time: 08/14/18 11:10 AM  Result Value Ref Range   Glucose-Capillary 199 (H) 70 - 99 mg/dL  HIV antibody (Routine Testing)     Status: None   Collection Time: 08/14/18  1:02 PM  Result Value Ref Range   HIV Screen 4th Generation wRfx Non Reactive Non Reactive    Comment: (NOTE) Performed At: [facility] [address] [place], [place] [id number] Jolene Schimke MD VH:[id number]   Troponin I - Now Then Q6H     Status: None   Collection Time: 08/14/18  1:02 PM  Result Value Ref Range   Troponin I <0.03 <0.03 ng/mL    Comment: Performed at [facility], [address] [place], [place] [address]  Protime-INR     Status: None   Collection Time: 08/14/18  1:02 PM  Result Value Ref Range   Prothrombin Time 13.5 11.4 - 15.2 seconds   INR 1.0 0.8 - 1.2    Comment: (NOTE) INR goal varies based on device and disease states. Performed at [facility], [address], [place], [place] [address]  APTT     Status: None   Collection Time: 08/14/18  1:02 PM  Result Value Ref Range   aPTT 29 24 - 36 seconds    Comment: Performed at [facility], [address] [place], [place] [address]  MRSA PCR Screening     Status: None   Collection Time: 08/14/18  2:36 PM  Result Value Ref Range   MRSA by PCR NEGATIVE NEGATIVE    Comment:         The GeneXpert MRSA Assay (FDA approved for NASAL specimens only), is one component of a comprehensive MRSA colonization surveillance program. It is not intended to diagnose MRSA infection nor to guide or monitor treatment for MRSA infections. Performed at [facility], [address] [place], [place] [address]   Glucose, capillary     Status: Abnormal   Collection Time: 08/14/18  4:01 PM  Result Value Ref Range   Glucose-Capillary 250 (H) 70 - 99 mg/dL  Troponin I - Now Then Q6H     Status: None   Collection Time: 08/14/18  7:46 PM  Result Value Ref Range   Troponin I <0.03 <0.03 ng/mL    Comment: Performed at [facility], [address], [place], [place] [address]  Heparin level (unfractionated)     Status: Abnormal   Collection Time: 08/14/18  7:46 PM  Result Value Ref Range   Heparin Unfractionated 0.73 (H) 0.30 - 0.70 IU/mL    Comment: (NOTE) If heparin results are below expected values, and patient dosage has  been confirmed, suggest follow up testing of antithrombin III levels. Performed at [facility], [address] [place], [place] [address]   Glucose, capillary     Status: Abnormal   Collection Time: 08/14/18  9:42 PM  Result Value Ref Range   Glucose-Capillary 184 (H) 70 - 99 mg/dL   Comment 1 Notify RN    Comment 2 Document in Chart   Troponin I - Now Then Q6H     Status: None   Collection Time: 08/15/18 12:36 AM  Result Value Ref Range   Troponin I <0.03 <0.03 ng/mL    Comment: Performed at [facility], [address] [place], [place] [address]  Magnesium     Status: None   Collection Time: 08/15/18  5:21 AM  Result Value Ref Range   Magnesium 1.8 1.7 - 2.4 mg/dL    Comment: Performed at [facility], [address], [place], [place] [address]  Basic metabolic panel     Status: Abnormal   Collection Time: 08/15/18  5:21 AM  Result Value Ref Range   Sodium 141 135 - 145 mmol/L   Potassium 3.3 (L) 3.5 - 5.1 mmol/L   Chloride 109 98 - 111 mmol/L   CO2  26 22 - 32 mmol/L   Glucose, Bld 78 70 - 99 mg/dL   BUN 14 6 - 20 mg/dL   Creatinine, Ser 5.78 0.61 - 1.24 mg/dL   Calcium 8.8 (L) 8.9 - 10.3 mg/dL   GFR calc non Af Amer >60 >60 mL/min   GFR calc Af Amer >60 >60 mL/min   Anion gap 6 5 - 15    Comment: Performed at [facility], [address] [place], [place] [address]  CBC     Status: None   Collection Time: 08/15/18  5:21 AM  Result Value Ref Range   WBC 6.8 4.0 - 10.5 K/uL   RBC 4.78 4.22 - 5.81 MIL/uL   Hemoglobin 14.6 13.0 - 17.0  g/dL   HCT 16.1 09.6 - 04.5 %   MCV 90.2 80.0 - 100.0 fL   MCH 30.5 26.0 - 34.0 pg   MCHC 33.9 30.0 - 36.0 g/dL   RDW 40.9 81.1 - 91.4 %   Platelets 160 150 - 400 K/uL   nRBC 0.0 0.0 - 0.2 %    Comment: Performed at [facility], [address], [place], [place] [address]  Heparin level (unfractionated)     Status: None   Collection Time: 08/15/18  5:21 AM  Result Value Ref Range   Heparin Unfractionated 0.44 0.30 - 0.70 IU/mL    Comment: (NOTE) If heparin results are below expected values, and patient dosage has  been confirmed, suggest follow up testing of antithrombin III levels. Performed at [facility], [address], [place], [place] [address]   Glucose, capillary     Status: Abnormal   Collection Time: 08/15/18  7:16 AM  Result Value Ref Range   Glucose-Capillary 109 (H) 70 - 99 mg/dL  Glucose, capillary     Status: Abnormal   Collection Time: 08/15/18 11:46 AM  Result Value Ref Range   Glucose-Capillary 146 (H) 70 - 99 mg/dL  ECHOCARDIOGRAM LIMITED     Status: None   Collection Time: 08/15/18  1:11 PM  Result Value Ref Range   Weight 2,694.9 oz   Height 72 in   BP 130/88 mmHg  Glucose, capillary     Status: None   Collection Time: 08/15/18  4:49 PM  Result Value Ref Range   Glucose-Capillary 88 70 - 99 mg/dL  Glucose, capillary     Status: Abnormal   Collection Time: 08/15/18  9:35 PM  Result Value Ref Range   Glucose-Capillary 178 (H) 70 - 99 mg/dL  CBC     Status: None    Collection Time: 08/16/18  4:04 AM  Result Value Ref Range   WBC 6.4 4.0 - 10.5 K/uL   RBC 4.70 4.22 - 5.81 MIL/uL   Hemoglobin 14.1 13.0 - 17.0 g/dL   HCT 86.5 78.4 - 69.6 %   MCV 92.1 80.0 - 100.0 fL   MCH 30.0 26.0 - 34.0 pg   MCHC 32.6 30.0 - 36.0 g/dL   RDW 29.5 28.4 - 13.2 %   Platelets 153 150 - 400 K/uL   nRBC 0.0 0.0 - 0.2 %    Comment: Performed at [facility], [address], [place], [place] [address]  Basic metabolic panel     Status: Abnormal   Collection Time: 08/16/18  4:04 AM  Result Value Ref Range   Sodium 141 135 - 145 mmol/L   Potassium 3.6 3.5 - 5.1 mmol/L   Chloride 107 98 - 111 mmol/L   CO2 26 22 - 32 mmol/L   Glucose, Bld 61 (L) 70 - 99 mg/dL   BUN 13 6 - 20 mg/dL   Creatinine, Ser 2.72 0.61 - 1.24 mg/dL   Calcium 8.6 (L) 8.9 - 10.3 mg/dL   GFR calc non Af Amer >60 >60 mL/min   GFR calc Af Amer >60 >60 mL/min   Anion gap 8 5 - 15    Comment: Performed at [facility], [address] [place], [place] [address]  Magnesium     Status: None   Collection Time: 08/16/18  4:04 AM  Result Value Ref Range   Magnesium 1.8 1.7 - 2.4 mg/dL    Comment: Performed at [facility], [address], [place], [place] [address]  Glucose, capillary  Status: None   Collection Time: 08/16/18  5:18 AM  Result Value Ref Range   Glucose-Capillary 87 70 - 99 mg/dL  Hemoglobin Z6X     Status: None   Collection Time: 09/05/18 12:00 AM  Result Value Ref Range   Hemoglobin A1C 11.2      Assessment & Plan:   1. Uncontrolled type 1 diabetes mellitus with hyperglycemia (HCC)  - Jeffery Beard was diagnosed with diabetes since 58 years of age, recently reclassified as L ADA.  -He returns with uncontrolled glycemic profile and A1c of 11.2%.  However, patient remains unengaged for proper monitoring of blood glucose.   Patient denies prior history of diabetes ketoacidosis.  Patient has history of alcohol use/abuse.  His maximum body weight has been 210 pounds with subsequent  significant unintentional weight loss down to 147, returning to 174 pounds after he was put on basal/bolus insulin program.    - I had a long discussion with him about the progressive nature of diabetes and the pathology behind its complications. -his diabetes is complicated by heavy smoking and he remains at a high risk for more acute and chronic complications which include CAD, CVA, CKD, retinopathy, and neuropathy. These are all discussed in detail with him.  - I have counseled him on diet management by adopting a carbohydrate restricted/protein rich diet.  Has gained 9 pounds since last visit, which is a good development for him. - Patient admits there is a room for improvement in his diet and drink choices. -  Suggestion is made for him to avoid simple carbohydrates  from his diet including Cakes, Sweet Desserts / Pastries, Ice Cream, Soda (diet and regular), Sweet Tea, Candies, Chips, Cookies, Store Bought Juices, Alcohol in Excess of  1-2 drinks a day, Artificial Sweeteners, and "Sugar-free" Products. This will help patient to have stable blood glucose profile and potentially avoid unintended weight gain.   - I encouraged him to switch to  unprocessed or minimally processed complex starch and increased protein intake (animal or plant source), fruits, and vegetables.  - he is advised to stick to a routine mealtimes to eat 3 meals  a day and avoid unnecessary snacks ( to snack only to correct hypoglycemia).   - he will be scheduled with Norm Salt, RDN, CDE for individualized diabetes education.  Weight loss is not advisable for him.  - I have approached him with the following individualized plan to manage diabetes and patient agrees:   -Given his prevailing glycemic burden, and his planned elective cardiac surgery, he will continue to intensify treatment with basal/bolus insulin in order for him to achieve reasonable control of glycemia.    -He is an engagement for proper monitoring  makes it difficult to optimize his insulin doses.    -He is advised to continue Toujeo 60 units nightly, increase Humalog to 8 units 3 times a day with meals  for pre-meal BG readings of 70-150mg /dl, plus patient specific correction dose for unexpected hyperglycemia above 150mg /dl, associated with strict monitoring of glucose 4 times a day-before meals and at bedtime. - he is warned not to take insulin without proper monitoring per orders. -Reportedly, he developed severe skin reaction when he applied his 14 days CGM device, and would like to avoid it for now. - Adjustment parameters are given to him for hypo and hyperglycemia in writing. -I wrote a new prescription for One Touch vario flex test strips. - he is encouraged to call clinic for blood glucose levels less than  70 or above 300 mg /dl. -He is advised to drop off or fax his logs in 10 days to review before his planned elective cardiac surgery.   - Patient specific target  A1c;  LDL, HDL, Triglycerides, and  Waist Circumference were discussed in detail.  2) Blood Pressure /Hypertension:  his blood pressure is controlled to target.  He is currently on diltiazem 120 mg p.o. daily.    3) Lipids/Hyperlipidemia:   Review of his recent lipid panel showed controlled  LDL at 74.  he  is not on statins.  He will be considered for low-dose statin therapy during subsequent visits.     4)  Weight/Diet:  Body mass index is 22.96 kg/m.   Weight loss is not advisable for him.  CDE Consult will be initiated . Exercise, and detailed carbohydrates information provided  -  detailed on discharge instructions.  5) subclinical hypothyroidism -His recent TSH is higher at 8.147.  He will benefit from early initiation of thyroid hormone supplement.  He will be initiated on levothyroxine 25 mcg p.o. every morning.    6) Chronic Care/Health Maintenance:  -he  Is not not on ACEI nor  Statin medications,  encouraged to initiate and continue to follow up with  Ophthalmology, Dentist,  Podiatrist at least yearly or according to recommendations, and advised to stay away from smoking (patient reports quitting 1 month ago). I have recommended yearly flu vaccine and pneumonia vaccine at least every 5 years; moderate intensity exercise for up to 150 minutes weekly; and  sleep for at least 7 hours a day.  - he is  advised to maintain close follow up with Benita StabileHall, John Z, MD for primary care needs, as well as his other providers for optimal and coordinated care.  - Time spent with the patient: 25 min, of which >50% was spent in reviewing his blood glucose logs , discussing his hypoglycemia and hyperglycemia episodes, reviewing his current and  previous labs / studies and medications  doses and developing a plan to avoid hypoglycemia and hyperglycemia. Please refer to Patient Instructions for Blood Glucose Monitoring and Insulin/Medications Dosing Guide"  in media tab for additional information. Please  also refer to " Patient Self Inventory" in the Media  tab for reviewed elements of pertinent patient history.  Jeffery KocherPhillip Bilotti participated in the discussions, expressed understanding, and voiced agreement with the above plans.  All questions were answered to his satisfaction. he is encouraged to contact clinic should he have any questions or concerns prior to his return visit.   Follow up plan: - Return in about 3 months (around 12/07/2018) for Meter, and Logs.  Marquis LunchGebre Saveah Bahar, MD [facility] [address] [place], [place] [address] Phone: [phone]  Fax: [phone]    09/06/2018, 4:51 PM  This note was partially dictated with voice recognition software. Similar sounding words can be transcribed inadequately or may not  be corrected upon review.

## 2018-09-06 NOTE — Patient Instructions (Signed)

## 2018-09-08 ENCOUNTER — Other Ambulatory Visit: Payer: Self-pay

## 2018-09-08 MED ORDER — GLUCOSE BLOOD VI STRP: ORAL_STRIP | 2 refills | Status: DC

## 2018-09-09 ENCOUNTER — Telehealth: Payer: Self-pay | Admitting: Cardiology

## 2018-09-09 ENCOUNTER — Encounter: Payer: Managed Care, Other (non HMO) | Admitting: Cardiothoracic Surgery

## 2018-09-09 NOTE — Telephone Encounter (Signed)
Please give pt a call-- he has some concerns about his thoracic aortic aneurysm   [phone]

## 2018-09-12 NOTE — Telephone Encounter (Signed)
Returned pt call. No answer, left message for pt to return call.  

## 2018-09-12 NOTE — Telephone Encounter (Signed)
Pt returned pt call., He was calling to inform Dr. Domenic Polite that his appointment with the thoracic surgeon was moved out 3 more weeks. (June 26th) He stated it has caused a lot of anxiety, but will try not to worry. Will forward to Dr. Domenic Polite as an Juluis Rainier.

## 2018-09-28 ENCOUNTER — Ambulatory Visit: Payer: Managed Care, Other (non HMO) | Admitting: "Endocrinology

## 2018-09-29 ENCOUNTER — Other Ambulatory Visit: Payer: Self-pay

## 2018-09-30 ENCOUNTER — Other Ambulatory Visit: Payer: Self-pay | Admitting: Cardiothoracic Surgery

## 2018-09-30 ENCOUNTER — Encounter: Payer: Managed Care, Other (non HMO) | Admitting: Cardiothoracic Surgery

## 2018-09-30 ENCOUNTER — Encounter: Payer: Self-pay | Admitting: Cardiothoracic Surgery

## 2018-09-30 ENCOUNTER — Institutional Professional Consult (permissible substitution): Payer: Managed Care, Other (non HMO) | Admitting: Cardiothoracic Surgery

## 2018-09-30 VITALS — BP 156/93 | HR 61 | Temp 97.9°F | Resp 20 | Ht 73.0 in | Wt 174.0 lb

## 2018-09-30 DIAGNOSIS — I712 Thoracic aortic aneurysm, without rupture: Secondary | ICD-10-CM | POA: Diagnosis not present

## 2018-09-30 DIAGNOSIS — R911 Solitary pulmonary nodule: Secondary | ICD-10-CM

## 2018-09-30 DIAGNOSIS — I7121 Aneurysm of the ascending aorta, without rupture: Secondary | ICD-10-CM

## 2018-09-30 MED ORDER — LISINOPRIL 10 MG PO TABS: 10.0000 mg | ORAL_TABLET | Freq: Every day | ORAL | 1 refills | Status: DC

## 2018-09-30 NOTE — Progress Notes (Signed)
PCP is Celene Squibb, MD Referring Provider is Herminio Commons, MD  Chief Complaint  Patient presents with  . 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  It was noted on a CT scan earlier in the month when he was admitted to [facility] for atrial fibrillation.  He underwent echocardiogram as well which demonstrated a probable bicuspid aortic valve with normal LV function.  Aortic valve without significant stenosis or insufficiency.  The patient was placed on Eliquis and Cardizem I am and feels better.  He still has some fatigue and decreased exercise tolerance on atrial fibrillation  CT scan of the chest also noted a 1.5 cm spiculated right upper lobe mass.  The patient was a heavy smoker but recently stopped smoking.  There is no mediastinal adenopathy.  There is evidence of emphysema with scattered blebs.  In 2002 at [facility] he had a right VATS and open lung biopsy which he states showed sarcoidosis.  He took prednisone for 6 months and nothing after that.  His bedside mechanics demonstrate good pulmonary function.  Patient denies family history of thoracic abdominal aortic aneurysm disease. No history of sudden death in his family. 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 Atrial fibrillation ([place])    Details unclear - patient states diagnosed in his 66s  . Bicuspid aortic valve   . Hyperlipidemia   .  Sarcoidosis   . Type 2 diabetes mellitus ([place])     Past Surgical History:  Procedure Laterality Date  . LUNG BIOPSY    . Nasal septum surgery    . PALATE / UVULA BIOPSY / EXCISION    . TONSILLECTOMY    . VASECTOMY      Family History  Problem Relation Age of Onset  . COPD Mother   . High blood pressure Mother   . Cancer - Prostate Father   . High blood pressure Father     Social History Social History   Tobacco Use  . Smoking status: Current Every Day Smoker    Packs/day: 1.00    Years: 25.00    Pack years: 25.00    Types: Cigarettes  . Smokeless tobacco: Never Used  Substance Use Topics  . Alcohol use: Never    Frequency: Never  . Drug use: Never    Current Outpatient Medications  Medication Sig Dispense Refill  . acetaminophen (TYLENOL) 650 MG CR tablet Take 650 mg by mouth every 8 (eight) hours as needed for pain.    Marland Kitchen apixaban (ELIQUIS) 5 MG TABS tablet Take 1 tablet (5 mg total) by mouth 2 (two) times daily for 30 days. 60 tablet 3  . CHANTIX 1 MG tablet Take 1 tablet by mouth 2 (two) times a day.    . diltiazem (CARDIZEM CD) 120 MG 24 hr capsule Take 1 capsule (120 mg total) by mouth daily. 30 capsule 11  . glucose blood (ONETOUCH VERIO) test strip Use as instructed 4 x daily 150 each  2  . ibuprofen (ADVIL) 200 MG tablet Take 400-600 mg by mouth every 6 (six) hours as needed.    . Insulin Glargine, 1 Unit Dial, (TOUJEO SOLOSTAR) 300 UNIT/ML SOPN Inject 60 Units into the skin at bedtime. 27 mL 0  . Insulin Lispro (HUMALOG KWIKPEN) 200 UNIT/ML SOPN Inject 8-11 Units into the skin 3 (three) times daily before meals. 5 pen 5  . levothyroxine (SYNTHROID) 25 MCG tablet Take 1 tablet (25 mcg total) by mouth daily before breakfast. 30 tablet 6  . lisinopril (ZESTRIL) 10 MG tablet Take 1 tablet (10 mg total) by mouth daily. 30 tablet 1   No current facility-administered medications for this visit.     Allergies  Allergen Reactions  . Codeine   .  Hydrocodone-Acetaminophen     Other reaction(s): Other (See Comments) Nervousness and itching    Review of Systems  Right-hand-dominant Tolerated right VATS and open lung biopsy without problems 2002 Active dental complaints with lower dental tenderness loose teeth and purulent drainage No edema Diabetes difficult to control associated weight loss of 20 pounds over the past year Positive heart murmur for several years from his bicuspid aortic valve  BP (!) 156/93   Pulse 61   Temp 97.9 F (36.6 C) (Skin)   Resp 20   Ht 6\' 1"  (1.854 m)   Wt 174 lb (78.9 kg)   SpO2 98% Comment: RA  BMI 22.96 kg/m  Physical Exam      Exam    General- alert and comfortable    Neck- no JVD, no cervical adenopathy palpable, no carotid bruit        Some necrotic lower teeth noted   Lungs- clear without rales, wheezes.  Well-healed right VATS incision fourth interspace anterior axillary line   Cor- irregular rate and rhythm, no murmur ,  No gallop   Abdomen- soft, non-tender   Extremities - warm, non-tender, minimal edema   Neuro- oriented, appropriate, no focal weakness   Diagnostic Tests: Images of CT scan showed a 4.9 cm smooth fusiform ascending aneurysm without mural thickening or ulceration.  Mild calcification at the arch.  There is a suspicious 1.5 cm spiculated right upper lobe mass.  Impression: #1 asymptomatic moderate fusiform ascending aneurysm.  No previous CT scans with which to compare.  Low risk for aortic dissection at current size and configuration.  Treatment will be smoking cessation, blood pressure monitoring and control and serial CT scans, next in 6 months  1.5 cm spiculated right upper lobe lung lesion suspicious for bronchogenic carcinoma.  Patient has had previous right lung surgery 18 years ago and gives a history of sarcoidosis.  We will try to get his old operative note and medical records from that time.  We will schedule him for a PET scan to assess the metabolic  activity of this lesion.  It may be related to his previous medical problems and surgery or it very well could be a new primary lung cancer.  Plan: Patient return for next visit after PET scan.  CT scan of thoracic aorta will be scheduled in 6 months.  Patient will start on lisinopril 10 mg a day for hypertension and a fusiform aneurysm.   Mikey BussingPeter Van Trigt III, MD [facility] [phone]

## 2018-09-30 NOTE — Progress Notes (Unsigned)
pet

## 2018-10-05 ENCOUNTER — Other Ambulatory Visit: Payer: Self-pay

## 2018-10-05 ENCOUNTER — Ambulatory Visit (HOSPITAL_COMMUNITY)
Admission: RE | Admit: 2018-10-05 | Discharge: 2018-10-05 | Disposition: A | Discharge status: Home / Self Care | Payer: Managed Care, Other (non HMO) | Source: Ambulatory Visit | Attending: Cardiothoracic Surgery | Admitting: Cardiothoracic Surgery

## 2018-10-05 DIAGNOSIS — R911 Solitary pulmonary nodule: Secondary | ICD-10-CM | POA: Diagnosis present

## 2018-10-05 LAB — GLUCOSE, CAPILLARY: Glucose-Capillary: 93 mg/dL (ref 70–99)

## 2018-10-05 MED ORDER — FLUDEOXYGLUCOSE F - 18 (FDG) INJECTION
8.5500 | Freq: Once | INTRAVENOUS | Status: AC | PRN
  Administered 2018-10-05: 8.55 via INTRAVENOUS

## 2018-10-06 ENCOUNTER — Ambulatory Visit: Payer: Managed Care, Other (non HMO) | Admitting: Cardiothoracic Surgery

## 2018-10-11 ENCOUNTER — Encounter: Payer: Self-pay | Admitting: Cardiothoracic Surgery

## 2018-10-11 ENCOUNTER — Other Ambulatory Visit: Payer: Self-pay

## 2018-10-11 ENCOUNTER — Ambulatory Visit: Payer: Managed Care, Other (non HMO) | Admitting: Cardiothoracic Surgery

## 2018-10-11 VITALS — BP 124/76 | HR 74 | Temp 97.9°F | Resp 20 | Ht 73.0 in | Wt 175.0 lb

## 2018-10-11 DIAGNOSIS — I7121 Aneurysm of the ascending aorta, without rupture: Secondary | ICD-10-CM

## 2018-10-11 DIAGNOSIS — I712 Thoracic aortic aneurysm, without rupture: Secondary | ICD-10-CM | POA: Diagnosis not present

## 2018-10-11 DIAGNOSIS — R911 Solitary pulmonary nodule: Secondary | ICD-10-CM | POA: Diagnosis not present

## 2018-10-11 NOTE — Progress Notes (Signed)
PCP is Celene Squibb, MD Referring Provider is Herminio Commons, MD  Chief Complaint  Patient presents with  . Lung Lesion    f/u after PET Scan 09/05/18    HPI: Patient returns for scheduled office visit after PET scan.  He was initially evaluated for a 4.8 cm fusiform ascending aneurysm, asymptomatic with bicuspid aortic valve by echo and good LV function.  That scan showed a nodular density in the right upper lobe suspicious for bronchogenic carcinoma.  Patient has had right VATS and lung biopsy for sarcoidosis 15 years ago.  I reviewed the images of the PET scan and discussed the report with the patient.  The nodule has no metabolic uptake and is very low risk for malignancy.  It is probably scar related to his sarcoidosis or previous surgery.  Patient understands we will need to continue to follow his asymptomatic fusiform aneurysm with surveillance CT scans, next in 6 months.  He does not have prior scans with which to compare.  The patient was started on low-dose lisinopril for hypertension on his last visit and today's blood pressure is fine at 124/80 pulse 74 and regular.   Past Medical History:  Diagnosis Date  . Atrial fibrillation ([place])    Details unclear - patient states diagnosed in his 82s  . Bicuspid aortic valve   . Hyperlipidemia   . Sarcoidosis   . Type 2 diabetes mellitus ([place])     Past Surgical History:  Procedure Laterality Date  . LUNG BIOPSY    . Nasal septum surgery    . PALATE / UVULA BIOPSY / EXCISION    . TONSILLECTOMY    . VASECTOMY      Family History  Problem Relation Age of Onset  . COPD Mother   . High blood pressure Mother   . Cancer - Prostate Father   . High blood pressure Father     Social History Social History   Tobacco Use  . Smoking status: Current Every Day Smoker    Packs/day: 1.00    Years: 25.00    Pack years: 25.00    Types: Cigarettes  . Smokeless tobacco: Never Used  Substance Use Topics  . Alcohol use: Never   Frequency: Never  . Drug use: Never    Current Outpatient Medications  Medication Sig Dispense Refill  . acetaminophen (TYLENOL) 650 MG CR tablet Take 650 mg by mouth every 8 (eight) hours as needed for pain.    . CHANTIX 1 MG tablet Take 1 tablet by mouth 2 (two) times a day.    . diltiazem (CARDIZEM CD) 120 MG 24 hr capsule Take 1 capsule (120 mg total) by mouth daily. 30 capsule 11  . glucose blood (ONETOUCH VERIO) test strip Use as instructed 4 x daily 150 each 2  . ibuprofen (ADVIL) 200 MG tablet Take 400-600 mg by mouth every 6 (six) hours as needed.    . Insulin Glargine, 1 Unit Dial, (TOUJEO SOLOSTAR) 300 UNIT/ML SOPN Inject 60 Units into the skin at bedtime. 27 mL 0  . Insulin Lispro (HUMALOG KWIKPEN) 200 UNIT/ML SOPN Inject 8-11 Units into the skin 3 (three) times daily before meals. 5 pen 5  . levothyroxine (SYNTHROID) 25 MCG tablet Take 1 tablet (25 mcg total) by mouth daily before breakfast. 30 tablet 6  . lisinopril (ZESTRIL) 10 MG tablet TAKE 1 TABLET(10 MG) BY MOUTH DAILY 90 tablet 0  . apixaban (ELIQUIS) 5 MG TABS tablet Take 1 tablet (5 mg total)  by mouth 2 (two) times daily for 30 days. 60 tablet 3   No current facility-administered medications for this visit.     Allergies  Allergen Reactions  . Codeine   . Hydrocodone-Acetaminophen     Other reaction(s): Other (See Comments) Nervousness and itching    Review of Systems   No new finding since his previous consultation 2 weeks ago  BP 124/76   Pulse 74   Temp 97.9 F (36.6 C) (Skin)   Resp 20   Ht 6\' 1"  (1.854 m)   Wt 175 lb (79.4 kg)   SpO2 93% Comment: RA  BMI 23.09 kg/m  Physical Exam      Exam    General- alert and comfortable    Neck- no JVD, no cervical adenopathy palpable, no carotid bruit   Lungs- clear without rales, wheezes   Cor- regular rate and rhythm, soft1/6 murmur of aortic sclerosis , no  gallop   Abdomen- soft, non-tender   Extremities - warm, non-tender, minimal edema   Neuro-  oriented, appropriate, no focal weakness   Diagnostic Tests: PET scan images show the right upper lobe nodule to have no metabolic activity consistent with a scar.  Impression: Patient has asymptomatic 4.8 cm fusiform ascending aneurysm with bicuspid valve. Hypertension well controlled on current meds He will need surveillance CT scans of his aorta-next will be in 6 months as previously scheduled  Plan: Return with CTA of chest chest in 6 months.  Smoking cessation recommended to patient.   Mikey BussingPeter Van Trigt III, MD [facility] [phone]

## 2018-10-12 ENCOUNTER — Ambulatory Visit: Payer: Managed Care, Other (non HMO) | Admitting: Cardiology

## 2018-10-13 ENCOUNTER — Encounter: Payer: Managed Care, Other (non HMO) | Attending: "Endocrinology | Admitting: Nutrition

## 2018-10-13 ENCOUNTER — Other Ambulatory Visit: Payer: Self-pay

## 2018-10-13 DIAGNOSIS — E118 Type 2 diabetes mellitus with unspecified complications: Secondary | ICD-10-CM | POA: Insufficient documentation

## 2018-10-13 DIAGNOSIS — E139 Other specified diabetes mellitus without complications: Secondary | ICD-10-CM | POA: Insufficient documentation

## 2018-10-13 DIAGNOSIS — E1165 Type 2 diabetes mellitus with hyperglycemia: Secondary | ICD-10-CM | POA: Insufficient documentation

## 2018-10-13 NOTE — Progress Notes (Signed)
  Medical Nutrition Therapy:  Appt start time: 1600 end time:  1700.   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 Lives with his wife. Dx was 6-7 years agol. Eats 1-4 meals per day. His wife and he cook and shopping. He notes he finds it difficult to eat on schedule. BS not controlled.  He just started see Dr. Dorris Fetch, Endocrinology. Toujeo 60 units daily and 8 units with sliding scale with meals. Skips meals but takes his Humalog. Re educated about not taking insulin if skipping meals. Willing to work on a better meals schedule and taking insulin as prescribed.  Lab Results  Component Value Date   HGBA1C 11.2 09/05/2018   Lipid Panel     Component Value Date/Time   CHOL 141 01/22/2018   TRIG 89 01/22/2018   HDL 49 01/22/2018   LDLCALC 79 01/22/2018    Preferred Learning Style:  No preference indicated   Learning Readiness Ready  Change in progress   MEDICATIONS: See list.   DIETARY INTAKE:   24-hr recall:  Eats 1-4 meals per day  Usual physical activity:ADL  Estimated energy needs: 2000 calories 225 g carbohydrates 150 g protein 56 g fat  Progress Towards Goal(s):  In progress.   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  As evidenced by A1C 11.2.    Intervention:  Nutrition Nutrition and Diabetes education provided on My Plate, CHO counting, meal planning, portion sizes, timing of meals, avoiding snacks between meals unless having a low blood sugar, target ranges for A1C and blood sugars, signs/symptoms and treatment of hyper/hypoglycemia, monitoring blood sugars, taking medications as prescribed, benefits of exercising 30 minutes per day and prevention of complications of DM. Marland Kitchen Goals  Follow My Plate  Eat 3 meals per day on times discussed Avoid snacks unless blood sugar is less than 70 mg/dl. Eat 60-75 grams of carbs per meal Increase fresh fruits and lots of lower carb vegetables. Drink only water  Walk 60 minutes 3-4 times per week Do not take insulin if skipping a meal Take insulin as prescribed  Teaching Method Utilized: Auditory  Telepone  Handouts given during visit include:  Emailed My Plate, Diabetes Instructions, S/s and treatment of hyper/hypoglycemia.    Barriers to learning/adherence to lifestyle change: none  Demonstrated degree of understanding via:  Teach Back   Monitoring/Evaluation:  Dietary intake, exercise, , and body weight in 1 month(s).

## 2018-10-24 ENCOUNTER — Ambulatory Visit: Payer: Managed Care, Other (non HMO) | Admitting: "Endocrinology

## 2018-11-02 ENCOUNTER — Other Ambulatory Visit: Payer: Self-pay

## 2018-11-02 ENCOUNTER — Encounter: Payer: Self-pay | Admitting: Cardiology

## 2018-11-02 ENCOUNTER — Encounter: Payer: Managed Care, Other (non HMO) | Admitting: Cardiology

## 2018-11-02 VITALS — BP 123/79 | HR 66 | Ht 72.0 in | Wt 174.0 lb

## 2018-11-02 NOTE — Progress Notes (Signed)
This encounter was created in error - please disregard.

## 2018-11-15 ENCOUNTER — Encounter: Payer: Self-pay | Admitting: Nutrition

## 2018-11-15 NOTE — Patient Instructions (Signed)
Goals  Follow My Plate  Eat 3 meals per day on times discussed Avoid snacks unless blood sugar is less than 70 mg/dl. Eat 60-75 grams of carbs per meal Increase fresh fruits and lots of lower carb vegetables. Drink only water  Walk 60 minutes 3-4 times per week Do not take insulin if skipping a meal Take insulin as prescribed.

## 2018-11-16 ENCOUNTER — Other Ambulatory Visit: Payer: Self-pay

## 2018-11-16 ENCOUNTER — Encounter: Payer: Managed Care, Other (non HMO) | Attending: "Endocrinology | Admitting: Nutrition

## 2018-11-16 DIAGNOSIS — E139 Other specified diabetes mellitus without complications: Secondary | ICD-10-CM | POA: Insufficient documentation

## 2018-11-16 NOTE — Progress Notes (Signed)
Telephone visit follow up.  Medical Nutrition Therapy:  Appt start time: 1600 end time:  1630.   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  Working on getting meals more consistent . Trying to eat meals on time. Sees Dr. Kern Alberta. No low blood sugars. BS are much more consistent now. Feels better.  Goals Previously set:  BS 150-180's.  [place] 60 units daily and Humalog 8 units plus sliding scale with meals. Follow My Plate - working on it.  Eat 3 meals per day on times discussed- more consistent Avoid snacks unless blood sugar is less than 70 mg/dl.-met. Eat 60-75 grams of carbs per meal-doing well Increase fresh fruits and lots of lower carb vegetables. Drink only water  Walk 60 minutes 3-4 times per week- met Do not take insulin if skipping a meal-met Take insulin as prescribed-met  Lab Results  Component Value Date   HGBA1C 11.2 09/05/2018   Lipid Panel     Component Value Date/Time   CHOL 141 01/22/2018   TRIG 89 01/22/2018   HDL 49 01/22/2018   [place] 79 01/22/2018    Preferred Learning Style:  No preference indicated   Learning Readiness Ready  Change in progress   MEDICATIONS: See list.   DIETARY INTAKE:   24-hr recall:  Eats 1-4 meals per day  Usual physical activity:ADL  Estimated energy needs: 2000 calories 225 g carbohydrates 150 g protein 56 g fat  Progress Towards Goal(s):  In progress.   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  As evidenced by A1C 11.2.    Intervention:  Nutrition Nutrition and Diabetes education provided on My Plate, CHO counting, meal planning, portion sizes, timing of meals, avoiding snacks between meals unless having a low blood sugar, target ranges for A1C and blood sugars, signs/symptoms and treatment of hyper/hypoglycemia, monitoring blood sugars, taking medications as prescribed, benefits of exercising 30 minutes per day and  prevention of complications of DM. Jeffery Beard Goals  Follow My Plate  Eat 3 meals per day on times discussed Avoid snacks unless blood sugar is less than 70 mg/dl. Eat 60-75 grams of carbs per meal Increase fresh fruits and lots of lower carb vegetables. Drink only water  Walk 60 minutes 3-4 times per week Do not take insulin if skipping a meal Take insulin as prescribed  Teaching Method Utilized: Auditory  Telepone  Handouts given during visit include:  Emailed My Plate, Diabetes Instructions, S/s and treatment of hyper/hypoglycemia.    Barriers to learning/adherence to lifestyle change: none  Demonstrated degree of understanding via:  Teach Back   Monitoring/Evaluation:  Dietary intake, exercise, , and body weight in 3 month(s).

## 2018-12-01 ENCOUNTER — Encounter: Payer: Managed Care, Other (non HMO) | Admitting: Nutrition

## 2018-12-08 ENCOUNTER — Ambulatory Visit: Payer: Managed Care, Other (non HMO) | Admitting: "Endocrinology

## 2018-12-08 ENCOUNTER — Other Ambulatory Visit: Payer: Self-pay | Admitting: "Endocrinology

## 2018-12-09 LAB — COMPREHENSIVE METABOLIC PANEL
ALT: 11 IU/L (ref 0–44)
AST: 12 IU/L (ref 0–40)
Albumin/Globulin Ratio: 2 (ref 1.2–2.2)
Albumin: 4.4 g/dL (ref 3.8–4.9)
Alkaline Phosphatase: 54 IU/L (ref 39–117)
BUN/Creatinine Ratio: 13 (ref 9–20)
BUN: 12 mg/dL (ref 6–24)
Bilirubin Total: 0.3 mg/dL (ref 0.0–1.2)
CO2: 25 mmol/L (ref 20–29)
Calcium: 9.2 mg/dL (ref 8.7–10.2)
Chloride: 98 mmol/L (ref 96–106)
Creatinine, Ser: 0.89 mg/dL (ref 0.76–1.27)
GFR calc Af Amer: 110 mL/min/{1.73_m2} (ref >59)
GFR calc non Af Amer: 95 mL/min/{1.73_m2} (ref >59)
Globulin, Total: 2.2 g/dL (ref 1.5–4.5)
Glucose: 318 mg/dL — ABNORMAL HIGH (ref 65–99)
Potassium: 4.3 mmol/L (ref 3.5–5.2)
Sodium: 135 mmol/L (ref 134–144)
Total Protein: 6.6 g/dL (ref 6.0–8.5)

## 2018-12-09 LAB — T4, FREE: Free T4: 1.2 ng/dL (ref 0.82–1.77)

## 2018-12-09 LAB — TSH: TSH: 5.22 u[IU]/mL — ABNORMAL HIGH (ref 0.450–4.500)

## 2018-12-09 LAB — HGB A1C W/O EAG: Hgb A1c MFr Bld: 10.7 % — ABNORMAL HIGH (ref 4.8–5.6)

## 2018-12-14 ENCOUNTER — Encounter: Payer: Self-pay | Admitting: Nutrition

## 2018-12-16 ENCOUNTER — Encounter: Payer: Self-pay | Admitting: "Endocrinology

## 2018-12-16 ENCOUNTER — Other Ambulatory Visit: Payer: Self-pay

## 2018-12-16 ENCOUNTER — Ambulatory Visit (INDEPENDENT_AMBULATORY_CARE_PROVIDER_SITE_OTHER): Payer: Managed Care, Other (non HMO) | Admitting: "Endocrinology

## 2018-12-16 DIAGNOSIS — E782 Mixed hyperlipidemia: Secondary | ICD-10-CM | POA: Diagnosis not present

## 2018-12-16 DIAGNOSIS — E139 Other specified diabetes mellitus without complications: Secondary | ICD-10-CM | POA: Diagnosis not present

## 2018-12-16 DIAGNOSIS — E039 Hypothyroidism, unspecified: Secondary | ICD-10-CM

## 2018-12-16 MED ORDER — FREESTYLE LIBRE 2 SENSOR SYSTM MISC: 1.0000 | 0 refills | Status: DC

## 2018-12-16 MED ORDER — TOUJEO SOLOSTAR 300 UNIT/ML ~~LOC~~ SOPN: 66.0000 [IU] | PEN_INJECTOR | Freq: Every day | SUBCUTANEOUS | 1 refills | Status: DC

## 2018-12-16 NOTE — Progress Notes (Addendum)
12/16/2018, 1:10 PM                                                    Endocrinology Telehealth Visit Follow up Note -During COVID -19 Pandemic  This visit type was conducted due to national recommendations for restrictions regarding the COVID-19 Pandemic  in an effort to limit this patient's exposure and mitigate transmission of the corona virus.  Due to his co-morbid illnesses, Jeffery Beard is at  moderate to high risk for complications without adequate follow up.  This format is felt to be most appropriate for him at this time.  I connected with this patient on 12/20/2018   by telephone and verified that I am speaking with the correct person using two identifiers. Jeffery KocherPhillip Boeke, 04-19-1960. he has verbally consented to this visit. All issues noted in this document were discussed and addressed. The format was not optimal for physical exam.    Subjective:    Patient ID: Jeffery Kocherhillip Berke, male    DOB: 04-19-1960.  Jeffery Kocherhillip Forbush is being engaged in telehealth via telephone in follow-up for management of currently uncontrolled symptomatic diabetes requested by  Benita StabileHall, John Z, MD.   Past Medical History:  Diagnosis Date  . Atrial fibrillation (HCC)    Details unclear - patient states diagnosed in his 720s  . Bicuspid aortic valve   . Hyperlipidemia   . Sarcoidosis   . Type 2 diabetes mellitus (HCC)    Past Surgical History:  Procedure Laterality Date  . LUNG BIOPSY    . Nasal septum surgery    . PALATE / UVULA BIOPSY / EXCISION    . TONSILLECTOMY    . VASECTOMY     Social History   Socioeconomic History  . Marital status: Married    Spouse name: Not on file  . Number of children: Not on file  . Years of education: Not on file  . Highest education level: Not on file  Occupational History  . Not on file  Social Needs  . Financial resource strain: Not on file  . Food insecurity     Worry: Not on file    Inability: Not on file  . Transportation needs    Medical: Not on file    Non-medical: Not on file  Tobacco Use  . Smoking status: Current Every Day Smoker    Packs/day: 1.00    Years: 25.00    Pack years: 25.00    Types: Cigarettes  . Smokeless tobacco: Never Used  Substance and Sexual Activity  . Alcohol use: Never    Frequency: Never  . Drug use: Never  . Sexual activity: Not on file  Lifestyle  . Physical activity    Days per week: Not on file    Minutes per session: Not on file  . Stress: Not on file  Relationships  . Social Musicianconnections    Talks on phone: Not on file    Gets together: Not on file  Attends religious service: Not on file    Active member of club or organization: Not on file    Attends meetings of clubs or organizations: Not on file    Relationship status: Not on file  Other Topics Concern  . Not on file  Social History Narrative  . Not on file   Outpatient Encounter Medications as of 12/16/2018  Medication Sig  . acetaminophen (TYLENOL) 650 MG CR tablet Take 650 mg by mouth every 8 (eight) hours as needed for pain.  Marland Kitchen. apixaban (ELIQUIS) 5 MG TABS tablet Take 1 tablet (5 mg total) by mouth 2 (two) times daily for 30 days.  . CHANTIX 1 MG tablet Take 1 tablet by mouth 2 (two) times a day.  Melene Muller. [START ON 12/19/2018] Continuous Blood Gluc Sensor (FREESTYLE LIBRE 2 SENSOR SYSTM) MISC 1 Piece by Does not apply route 2 (two) times a week.  . diltiazem (CARDIZEM CD) 120 MG 24 hr capsule Take 1 capsule (120 mg total) by mouth daily.  Marland Kitchen. glucose blood (ONETOUCH VERIO) test strip Use as instructed 4 x daily  . ibuprofen (ADVIL) 200 MG tablet Take 400-600 mg by mouth every 6 (six) hours as needed.  . Insulin Glargine, 1 Unit Dial, (TOUJEO SOLOSTAR) 300 UNIT/ML SOPN Inject 66 Units into the skin at bedtime.  . Insulin Lispro (HUMALOG KWIKPEN) 200 UNIT/ML SOPN Inject 8-11 Units into the skin 3 (three) times daily before meals.  Marland Kitchen. levothyroxine  (SYNTHROID) 25 MCG tablet Take 1 tablet (25 mcg total) by mouth daily before breakfast.  . lisinopril (ZESTRIL) 10 MG tablet TAKE 1 TABLET(10 MG) BY MOUTH DAILY  . [DISCONTINUED] Insulin Glargine, 1 Unit Dial, (TOUJEO SOLOSTAR) 300 UNIT/ML SOPN Inject 60 Units into the skin at bedtime.   No facility-administered encounter medications on file as of 12/16/2018.     ALLERGIES: Allergies  Allergen Reactions  . Codeine   . Hydrocodone-Acetaminophen     Other reaction(s): Other (See Comments) Nervousness and itching    VACCINATION STATUS:  There is no immunization history on file for this patient.  Diabetes He presents for his follow-up diabetic visit. He has type 2 diabetes mellitus. Onset time: He was diagnosed at approximate age of 50 years. His disease course has been improving. There are no hypoglycemic associated symptoms. Pertinent negatives for hypoglycemia include no confusion, headaches, pallor or seizures. Associated symptoms include polydipsia and polyuria. Pertinent negatives for diabetes include no chest pain, no fatigue, no polyphagia and no weakness. There are no hypoglycemic complications. Symptoms are improving. Risk factors for coronary artery disease include diabetes mellitus, dyslipidemia, male sex, tobacco exposure and sedentary lifestyle. Current diabetic treatment includes insulin injections (He is currently on Toujeo 40 units twice daily, Humalog 15 units 3 times daily AC.). His weight is increasing steadily. He is following a generally unhealthy diet. When asked about meal planning, he reported none. He has not had a previous visit with a dietitian. He rarely participates in exercise. His home blood glucose trend is fluctuating minimally. His breakfast blood glucose range is generally >200 mg/dl. His lunch blood glucose range is generally >200 mg/dl. His dinner blood glucose range is generally >200 mg/dl. His bedtime blood glucose range is generally >200 mg/dl. His overall  blood glucose range is >200 mg/dl. (He is glycemic profile still fluctuating, average blood glucose is greater than 200 mg per DL.  His previsit labs show A1c of 10.7% improving from 11.2%.   ) An ACE inhibitor/angiotensin II receptor blocker is not being taken.  Hyperlipidemia This is a chronic problem. The current episode started more than 1 year ago. The problem is controlled. Exacerbating diseases include diabetes. Pertinent negatives include no chest pain, myalgias or shortness of breath. Risk factors for coronary artery disease include diabetes mellitus, dyslipidemia, male sex and a sedentary lifestyle.   Review of systems: Limited as above.  Objective:    There were no vitals taken for this visit.  Wt Readings from Last 3 Encounters:  11/02/18 174 lb (78.9 kg)  10/11/18 175 lb (79.4 kg)  09/30/18 174 lb (78.9 kg)     Recent Results (from the past 2160 hour(s))  Glucose, capillary     Status: None   Collection Time: 10/05/18  7:05 AM  Result Value Ref Range   Glucose-Capillary 93 70 - 99 mg/dL  Comprehensive metabolic panel     Status: Abnormal   Collection Time: 12/08/18  5:11 PM  Result Value Ref Range   Glucose 318 (H) 65 - 99 mg/dL   BUN 12 6 - 24 mg/dL   Creatinine, Ser 0.10 0.76 - 1.27 mg/dL   GFR calc non Af Amer 95 >59 mL/min/1.73   GFR calc Af Amer 110 >59 mL/min/1.73   BUN/Creatinine Ratio 13 9 - 20   Sodium 135 134 - 144 mmol/L   Potassium 4.3 3.5 - 5.2 mmol/L   Chloride 98 96 - 106 mmol/L   CO2 25 20 - 29 mmol/L   Calcium 9.2 8.7 - 10.2 mg/dL   Total Protein 6.6 6.0 - 8.5 g/dL   Albumin 4.4 3.8 - 4.9 g/dL   Globulin, Total 2.2 1.5 - 4.5 g/dL   Albumin/Globulin Ratio 2.0 1.2 - 2.2   Bilirubin Total 0.3 0.0 - 1.2 mg/dL   Alkaline Phosphatase 54 39 - 117 IU/L   AST 12 0 - 40 IU/L   ALT 11 0 - 44 IU/L  Hgb A1c w/o eAG     Status: Abnormal   Collection Time: 12/08/18  5:11 PM  Result Value Ref Range   Hgb A1c MFr Bld 10.7 (H) 4.8 - 5.6 %    Comment:           Prediabetes: 5.7 - 6.4          Diabetes: >6.4          Glycemic control for adults with diabetes: <7.0   T4, free     Status: None   Collection Time: 12/08/18  5:11 PM  Result Value Ref Range   Free T4 1.20 0.82 - 1.77 ng/dL  TSH     Status: Abnormal   Collection Time: 12/08/18  5:11 PM  Result Value Ref Range   TSH 5.220 (H) 0.450 - 4.500 uIU/mL     Assessment & Plan:   1. Uncontrolled type 1 diabetes mellitus with hyperglycemia (HCC)  - Endy Mazzaferro was diagnosed with diabetes since 58 years of age, recently reclassified as L ADA.  -He reports still significantly fluctuating glycemic profile including some rare and random hypoglycemia, average still greater than 200 mg per DL.  His previsit labs show A1c of 10.7%, slightly bulging from 11.2%.    Patient denies prior history of diabetes ketoacidosis.  Patient has history of alcohol use/abuse.  His maximum body weight has been 210 pounds with subsequent significant unintentional weight loss down to 147, returning to 174 pounds after he was put on basal/bolus insulin program.   - I had a long discussion with him about the progressive nature of diabetes and the pathology  behind its complications. -his diabetes is complicated by heavy smoking and he remains at a high risk for more acute and chronic complications which include CAD, CVA, CKD, retinopathy, and neuropathy. These are all discussed in detail with him.  - I have counseled him on diet management by adopting a carbohydrate restricted/protein rich diet.  Has gained 9 pounds since last visit, which is a good development for him.  - he  admits there is a room for improvement in his diet and drink choices. -  Suggestion is made for him to avoid simple carbohydrates  from his diet including Cakes, Sweet Desserts / Pastries, Ice Cream, Soda (diet and regular), Sweet Tea, Candies, Chips, Cookies, Sweet Pastries,  Store Bought Juices, Alcohol in Excess of  1-2 drinks a day,  Artificial Sweeteners, Coffee Creamer, and "Sugar-free" Products. This will help patient to have stable blood glucose profile and potentially avoid unintended weight gain.   - I encouraged him to switch to  unprocessed or minimally processed complex starch and increased protein intake (animal or plant source), fruits, and vegetables.  - he is advised to stick to a routine mealtimes to eat 3 meals  a day and avoid unnecessary snacks ( to snack only to correct hypoglycemia).   -  Weight loss is not advisable for him.  - I have approached him with the following individualized plan to manage diabetes and patient agrees:   -Given his prevailing glycemic burden, he will continue to need intensive treatment with basal/bolus insulin in order for him to achieve and maintain control of diabetes to target.  -He is advised to increase Toujeo to 66  units nightly, continue Humalog to 8 units 3 times a day with meals  for pre-meal BG readings of 70-150mg /dl, plus patient specific correction dose for unexpected hyperglycemia above 150mg /dl, associated with strict monitoring of glucose 4 times a day-before meals and at bedtime. -He is open to consider the freestyle libre CGM, discussed and prescribed for him.  He would greatly benefit from this device. - he is warned not to take insulin without proper monitoring per orders.  - Adjustment parameters are given to him for hypo and hyperglycemia in writing.  - he is encouraged to call clinic for blood glucose levels less than 70 or above 300 mg /dl.   - Patient specific target  A1c;  LDL, HDL, Triglycerides, and  Waist Circumference were discussed in detail.  2) Blood Pressure /Hypertension: he is advised to home monitor blood pressure and report if > 140/90 on 2 separate readings. He is currently on diltiazem 120 mg p.o. daily.    3) Lipids/Hyperlipidemia:   Review of his recent lipid panel showed controlled  LDL at 74.  he  is not on statins.  He will be  considered for low-dose statin therapy during subsequent visits.     4)  Weight/Diet: Weight loss is not advisable for him.  CDE Consult will be initiated . Exercise, and detailed carbohydrates information provided  -  detailed on discharge instructions.  5) hypothyroidism -His previsit thyroid function tests show improvement in his TSH and free T4.  He is advised to continue  levothyroxine 25 mcg p.o. every morning.  - We discussed about the correct intake of his thyroid hormone, on empty stomach at fasting, with water, separated by at least 30 minutes from breakfast and other medications,  and separated by more than 4 hours from calcium, iron, multivitamins, acid reflux medications (PPIs). -Patient is made aware of the  fact that thyroid hormone replacement is needed for life, dose to be adjusted by periodic monitoring of thyroid function tests.   6) Chronic Care/Health Maintenance:  -he  Is not not on ACEI nor  Statin medications,  encouraged to initiate and continue to follow up with Ophthalmology, Dentist,  Podiatrist at least yearly or according to recommendations, and advised to stay away from smoking (patient reports quitting 1 month ago). I have recommended yearly flu vaccine and pneumonia vaccine at least every 5 years; moderate intensity exercise for up to 150 minutes weekly; and  sleep for at least 7 hours a day.  - he is  advised to maintain close follow up with Benita Stabile, MD for primary care needs, as well as his other providers for optimal and coordinated care. - Patient Care Time Today:  25 min, of which >50% was spent in  counseling and the rest reviewing his  current and  previous labs/studies, previous treatments, his blood glucose readings, and medications' doses and developing a plan for long-term care based on the latest recommendations for standards of care.   Jeffery Kocher participated in the discussions, expressed understanding, and voiced agreement with the above  plans.  All questions were answered to his satisfaction. he is encouraged to contact clinic should he have any questions or concerns prior to his return visit.   Follow up plan: - Return in about 4 months (around 04/17/2019) for Bring Meter and Logs- A1c in Office, Include 8 log sheets.  Marquis Lunch, MD [facility] [address] [place], [place] [address] Phone: [phone]  Fax: [phone]    12/16/2018, 1:10 PM  This note was partially dictated with voice recognition software. Similar sounding words can be transcribed inadequately or may not  be corrected upon review.

## 2019-01-13 ENCOUNTER — Other Ambulatory Visit: Payer: Self-pay | Admitting: Cardiology

## 2019-01-13 MED ORDER — APIXABAN 5 MG PO TABS: 5.0000 mg | ORAL_TABLET | Freq: Two times a day (BID) | ORAL | 3 refills | Status: DC

## 2019-01-13 NOTE — Telephone Encounter (Signed)
Patient is requesting refill on Eliquis sent to [facility] on [address] / tg

## 2019-02-07 NOTE — Progress Notes (Signed)
Cardiology Office Note  Date: 02/08/2019   ID: Jeffery Kocherhillip Ellsworth, DOB [birth year], MRN [id number]  PCP:  Benita StabileHall, Jeffery Z, MD  Cardiologist:  Nona DellSamuel McDowell, MD Electrophysiologist:  None   Chief Complaint  Patient presents with  . Cardiac follow-up    History of Present Illness: Jeffery Beard is a [age] y.o. male last assessed via telehealth encounter in May.  He presents for a follow-up visit.  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   He is following with Dr. Donata ClayVan Trigt with a 4.9 cm fusiform ascending aortic aneurysm that is asymptomatic in association with bicuspid aortic valve. He also had a right upper lobe nodule that was found to have no metabolic activity by PET imaging and suggestive of scar.  Echocardiogram from May revealed overall mild to moderate aortic stenosis.  He does report intermittent fatigue and at times chest discomfort, although not necessarily exertional.  He feels "different" inside.  I talked with him about considering further ischemic work-up as it is unlikely that his current degree of aortic stenosis is leading to symptoms.  He tells me that due to financial concerns he does not want to pursue any further testing at this point.  He missed his evening medications yesterday, blood pressure is elevated today.  I talked with him about medication compliance point, we went over his medication list.  Also discussed giving him a prescription for as needed nitroglycerin.  He does not report any bleeding problems on Eliquis.  I reviewed his lab work from September as outlined below.  Past Medical History:  Diagnosis Date  . Atrial fibrillation (HCC)    Details unclear - patient states diagnosed in his 520s  . Bicuspid aortic valve   . Hyperlipidemia   . Sarcoidosis   . Type 2 diabetes mellitus (HCC)     Past Surgical History:  Procedure Laterality Date  . LUNG BIOPSY    . Nasal septum surgery    . PALATE / UVULA BIOPSY / EXCISION    .  TONSILLECTOMY    . VASECTOMY      Current Outpatient Medications  Medication Sig Dispense Refill  . apixaban (ELIQUIS) 5 MG TABS tablet Take 1 tablet (5 mg total) by mouth 2 (two) times daily. 60 tablet 3  . Continuous Blood Gluc Sensor (FREESTYLE LIBRE 2 SENSOR SYSTM) MISC 1 Piece by Does not apply route 2 (two) times a week. 6 each 0  . diltiazem (CARDIZEM CD) 120 MG 24 hr capsule Take 1 capsule (120 mg total) by mouth daily. 30 capsule 11  . glucose blood (ONETOUCH VERIO) test strip Use as instructed 4 x daily 150 each 2  . ibuprofen (ADVIL) 200 MG tablet Take 400-600 mg by mouth every 6 (six) hours as needed.    . Insulin Glargine, 1 Unit Dial, (TOUJEO SOLOSTAR) 300 UNIT/ML SOPN Inject 66 Units into the skin at bedtime. 27 mL 1  . Insulin Lispro (HUMALOG KWIKPEN) 200 UNIT/ML SOPN Inject 8-11 Units into the skin 3 (three) times daily before meals. 5 pen 5  . levothyroxine (SYNTHROID) 25 MCG tablet Take 1 tablet (25 mcg total) by mouth daily before breakfast. 30 tablet 6  . lisinopril (ZESTRIL) 10 MG tablet TAKE 1 TABLET(10 MG) BY MOUTH DAILY 90 tablet 0  . acetaminophen (TYLENOL) 650 MG CR tablet Take 650 mg by mouth every 8 (eight) hours as needed for pain.    . nitroGLYCERIN (NITROSTAT) 0.4 MG SL tablet Place 1 tablet (0.4 mg total)  under the tongue every 5 (five) minutes as needed for chest pain. 25 tablet 3   No current facility-administered medications for this visit.    Allergies:  Codeine and Hydrocodone-acetaminophen   Social History: The patient  reports that he has been smoking cigarettes. He has a 25.00 pack-year smoking history. He has never used smokeless tobacco. He reports that he does not drink alcohol or use drugs.   ROS:  Please see the history of present illness. Otherwise, complete review of systems is positive for none.  All other systems are reviewed and negative.   Physical Exam: VS:  BP (!) 139/91   Pulse 74   Temp 98.4 F (36.9 C)   Ht 6' (1.829 m)   Wt 174  lb (78.9 kg)   SpO2 98%   BMI 23.60 kg/m , BMI Body mass index is 23.6 kg/m.  Wt Readings from Last 3 Encounters:  02/08/19 174 lb (78.9 kg)  11/02/18 174 lb (78.9 kg)  10/11/18 175 lb (79.4 kg)    General: Patient appears comfortable at rest. HEENT: Conjunctiva and lids normal, wearing a mask. Neck: Supple, no elevated JVP or carotid bruits, no thyromegaly. Lungs: Clear to auscultation, nonlabored breathing at rest. Cardiac: Regular rate and rhythm, no S3, 2/6 systolic murmur. Abdomen: Soft, nontender, bowel sounds present. Extremities: No pitting edema, distal pulses 2+. Skin: Warm and dry. Musculoskeletal: No kyphosis. Neuropsychiatric: Alert and oriented x3, affect grossly appropriate.  ECG:  An ECG dated 08/15/2018 was personally reviewed today and demonstrated:  Atrial flutter with 2:1 block and LVH.  Recent Labwork: 08/16/2018: Hemoglobin 14.1; Magnesium 1.8; Platelets 153 12/08/2018: ALT 11; AST 12; BUN 12; Creatinine, Ser 0.89; Potassium 4.3; Sodium 135; TSH 5.220     Component Value Date/Time   CHOL 141 01/22/2018   TRIG 89 01/22/2018   HDL 49 01/22/2018   LDLCALC 79 01/22/2018    Other Studies Reviewed Today:  Echocardiogram 08/15/2018: 1. The left ventricle has hyperdynamic systolic function, with an ejection fraction of >65%. The cavity size was normal. There is mild concentric left ventricular hypertrophy. Left ventricular diastolic function could not be evaluated secondary to  atrial fibrillation. No evidence of left ventricular regional wall motion abnormalities. 2. The right ventricle has normal systolic function. The cavity was normal. There is no increase in right ventricular wall thickness. 3. The mitral valve is grossly normal. 4. The tricuspid valve is grossly normal. 5. The aortic valveis bicuspid. Moderate thickening of the aortic valve. Moderate calcification of the aortic valve. Aortic valve regurgitation is mild by color flow Doppler. Mild  stenosis of the aortic valve. 6. The aortic root is normal in size and structure. 7. There is mild dilatation of the ascending aorta measuring 38 mm.  PET scan 10/05/2018: IMPRESSION: 1. No significant metabolic activity associated with the RIGHT upper lobe pulmonary nodule. Nodule is stable in size compared to CT 06/14/2018. Favor a benign nodule which may relate to patient's history of sarcoidosis. Recommend follow-up CT in 6 - 9 months to demonstrate more long-term size stability. 2. Stable thin wall cysts within lungs. 3. No lymphadenopathy. 4. Stable aneurysmal dilatation of the ascending aorta at 4.6 cm.  Chest CT 08/14/2018: IMPRESSION: 14 mm spiculated lesion in the right upper lobe consistent with primary pulmonary neoplasm till proven otherwise. Consider one of the following in 3 months for both low-risk and high-risk individuals: (a) repeat chest CT, (b) follow-up PET-CT, or (c) tissue sampling. This recommendation follows the consensus statement: Guidelines for Management of  Incidental Pulmonary Nodules Detected on CT Images: From the Fleischner Society 2017; Radiology 2017; 284:228-243.  Ascending thoracic aortic aneurysm measuring 4.9 cm. Recommend semi-annual imaging followup by CTA or MRA and referral to cardiothoracic surgery if not already obtained. This recommendation follows 2010 ACCF/AHA/AATS/ACR/ASA/SCA/SCAI/SIR/STS/SVM Guidelines for the Diagnosis and Management of Patients With Thoracic Aortic Disease. Circulation. 2010; 121: B147-W295. Aortic aneurysm NOS (ICD10-I71.9)  Assessment and Plan:  1.  Bicuspid aortic valve with mild to moderate aortic stenosis, most recent echocardiogram done in May of this year.  LVEF greater than 65%.  2.  Fusiform ascending aortic aneurysm measuring 4.9 cm by most recent evaluation.  He is following with Dr. Prescott Gum and will have follow-up imaging later in the year.  He is asymptomatic at this time.  3.  Right upper  lobe nodule without metabolic activity based on pet imaging and likely consistent with scar.  He does have a prior history of sarcoidosis as noted previously.  4.  Intermittent fatigue, chest discomfort.  Unlikely to be related to degree of aortic stenosis.  He does have poorly controlled type 2 diabetes mellitus and hypertension, hemoglobin A1c was 10.7% a few months ago.  I talked with him about medication compliance, regular follow-up with endocrinology.  I also recommended that we do follow-up ischemic testing at this time (cardiac CTA would be a consideration), but he wanted to hold off mainly expressing financial concerns.  Prescription for nitroglycerin provided.  We discussed warning signs and symptoms as well.  5.  History of atrial fibrillation and flutter.  Reports intermittent, brief palpitations.  He continues on Eliquis for stroke prophylaxis and also Cardizem CD.  Medication Adjustments/Labs and Tests Ordered: Current medicines are reviewed at length with the patient today.  Concerns regarding medicines are outlined above.   Tests Ordered: No orders of the defined types were placed in this encounter.   Medication Changes: Meds ordered this encounter  Medications  . nitroGLYCERIN (NITROSTAT) 0.4 MG SL tablet    Sig: Place 1 tablet (0.4 mg total) under the tongue every 5 (five) minutes as needed for chest pain.    Dispense:  25 tablet    Refill:  3    Disposition:  Follow up 6 months in the [place] office.  Signed, Satira Sark, MD, [facility] 02/08/2019 2:15 PM    [facility] [address] [place], [place] [address] Phone: [phone]; Fax: [phone]

## 2019-02-08 ENCOUNTER — Encounter: Payer: Self-pay | Admitting: Cardiology

## 2019-02-08 ENCOUNTER — Ambulatory Visit: Payer: Managed Care, Other (non HMO) | Admitting: Cardiology

## 2019-02-08 ENCOUNTER — Other Ambulatory Visit: Payer: Self-pay

## 2019-02-08 VITALS — BP 139/91 | HR 74 | Temp 98.4°F | Ht 72.0 in | Wt 174.0 lb

## 2019-02-08 DIAGNOSIS — I4892 Unspecified atrial flutter: Secondary | ICD-10-CM

## 2019-02-08 DIAGNOSIS — I712 Thoracic aortic aneurysm, without rupture, unspecified: Secondary | ICD-10-CM

## 2019-02-08 DIAGNOSIS — Z862 Personal history of diseases of the blood and blood-forming organs and certain disorders involving the immune mechanism: Secondary | ICD-10-CM | POA: Diagnosis not present

## 2019-02-08 DIAGNOSIS — I4891 Unspecified atrial fibrillation: Secondary | ICD-10-CM

## 2019-02-08 DIAGNOSIS — Q231 Congenital insufficiency of aortic valve: Secondary | ICD-10-CM

## 2019-02-08 MED ORDER — NITROGLYCERIN 0.4 MG SL SUBL: 0.4000 mg | SUBLINGUAL_TABLET | SUBLINGUAL | 3 refills | Status: DC | PRN

## 2019-02-08 NOTE — Patient Instructions (Signed)
Medication Instructions:  Take nitroglycerin as directed *If you need a refill on your cardiac medications before your next appointment, please call your pharmacy*  Lab Work: None today If you have labs (blood work) drawn today and your tests are completely normal, you will receive your results only by: Marland Kitchen MyChart Message (if you have MyChart) OR . A paper copy in the mail If you have any lab test that is abnormal or we need to change your treatment, we will call you to review the results.  Testing/Procedures: None today  Follow-Up: At [facility], you and your health needs are our priority.  As part of our continuing mission to provide you with exceptional heart care, we have created designated Provider Care Teams.  These Care Teams include your primary Cardiologist (physician) and Advanced Practice Providers (APPs -  Physician Assistants and Nurse Practitioners) who all work together to provide you with the care you need, when you need it.  Your next appointment:   6 months  The format for your next appointment:   In Person  Provider:   Nona Dell, MD  Other Instructions Nitroglycerin sublingual tablets What is this medicine? NITROGLYCERIN (nye troe GLI ser in) is a type of vasodilator. It relaxes blood vessels, increasing the blood and oxygen supply to your heart. This medicine is used to relieve chest pain caused by angina. It is also used to prevent chest pain before activities like climbing stairs, going outdoors in cold weather, or sexual activity. This medicine may be used for other purposes; ask your health care provider or pharmacist if you have questions. COMMON BRAND NAME(S): Nitroquick, Nitrostat, Nitrotab What should I tell my health care provider before I take this medicine? They need to know if you have any of these conditions:  anemia  head injury, recent stroke, or bleeding in the brain  liver disease  previous heart attack  an unusual or allergic  reaction to nitroglycerin, other medicines, foods, dyes, or preservatives  pregnant or trying to get pregnant  breast-feeding How should I use this medicine? Take this medicine by mouth as needed. At the first sign of an angina attack (chest pain or tightness) place one tablet under your tongue. You can also take this medicine 5 to 10 minutes before an event likely to produce chest pain. Follow the directions on the prescription label. Let the tablet dissolve under the tongue. Do not swallow whole. Replace the dose if you accidentally swallow it. It will help if your mouth is not dry. Saliva around the tablet will help it to dissolve more quickly. Do not eat or drink, smoke or chew tobacco while a tablet is dissolving. If you are not better within 5 minutes after taking ONE dose of nitroglycerin, call 9-1-1 immediately to seek emergency medical care. Do not take more than 3 nitroglycerin tablets over 15 minutes. If you take this medicine often to relieve symptoms of angina, your doctor or health care professional may provide you with different instructions to manage your symptoms. If symptoms do not go away after following these instructions, it is important to call 9-1-1 immediately. Do not take more than 3 nitroglycerin tablets over 15 minutes. Talk to your pediatrician regarding the use of this medicine in children. Special care may be needed. Overdosage: If you think you have taken too much of this medicine contact a poison control center or emergency room at once. NOTE: This medicine is only for you. Do not share this medicine with others. What if  I miss a dose? This does not apply. This medicine is only used as needed. What may interact with this medicine? Do not take this medicine with any of the following medications:  certain migraine medicines like ergotamine and dihydroergotamine (DHE)  medicines used to treat erectile dysfunction like sildenafil, tadalafil, and  vardenafil  riociguat This medicine may also interact with the following medications:  alteplase  aspirin  heparin  medicines for high blood pressure  medicines for mental depression  other medicines used to treat angina  phenothiazines like chlorpromazine, mesoridazine, prochlorperazine, thioridazine This list may not describe all possible interactions. Give your health care provider a list of all the medicines, herbs, non-prescription drugs, or dietary supplements you use. Also tell them if you smoke, drink alcohol, or use illegal drugs. Some items may interact with your medicine. What should I watch for while using this medicine? Tell your doctor or health care professional if you feel your medicine is no longer working. Keep this medicine with you at all times. Sit or lie down when you take your medicine to prevent falling if you feel dizzy or faint after using it. Try to remain calm. This will help you to feel better faster. If you feel dizzy, take several deep breaths and lie down with your feet propped up, or bend forward with your head resting between your knees. You may get drowsy or dizzy. Do not drive, use machinery, or do anything that needs mental alertness until you know how this drug affects you. Do not stand or sit up quickly, especially if you are an older patient. This reduces the risk of dizzy or fainting spells. Alcohol can make you more drowsy and dizzy. Avoid alcoholic drinks. Do not treat yourself for coughs, colds, or pain while you are taking this medicine without asking your doctor or health care professional for advice. Some ingredients may increase your blood pressure. What side effects may I notice from receiving this medicine? Side effects that you should report to your doctor or health care professional as soon as possible:  blurred vision  dry mouth  skin rash  sweating  the feeling of extreme pressure in the head  unusually weak or tired Side  effects that usually do not require medical attention (report to your doctor or health care professional if they continue or are bothersome):  flushing of the face or neck  headache  irregular heartbeat, palpitations  nausea, vomiting This list may not describe all possible side effects. Call your doctor for medical advice about side effects. You may report side effects to FDA at 1-800-FDA-1088. Where should I keep my medicine? Keep out of the reach of children. Store at room temperature between 20 and 25 degrees C (68 and 77 degrees F). Store in [facility] of Staff. Protect from light and moisture. Keep tightly closed. Throw away any unused medicine after the expiration date. NOTE: This sheet is a summary. It may not cover all possible information. If you have questions about this medicine, talk to your doctor, pharmacist, or health care provider.  2020 Elsevier/Gold Standard (2013-01-19 17:57:36)

## 2019-02-24 ENCOUNTER — Other Ambulatory Visit: Payer: Self-pay | Admitting: Cardiothoracic Surgery

## 2019-02-24 DIAGNOSIS — I712 Thoracic aortic aneurysm, without rupture, unspecified: Secondary | ICD-10-CM

## 2019-03-20 ENCOUNTER — Ambulatory Visit
Admission: EM | Admit: 2019-03-20 | Discharge: 2019-03-20 | Disposition: A | Discharge status: Home / Self Care | Payer: Managed Care, Other (non HMO) | Attending: Emergency Medicine | Admitting: Emergency Medicine

## 2019-03-20 ENCOUNTER — Other Ambulatory Visit: Payer: Self-pay

## 2019-03-20 DIAGNOSIS — S61012A Laceration without foreign body of left thumb without damage to nail, initial encounter: Secondary | ICD-10-CM

## 2019-03-20 MED ORDER — MUPIROCIN CALCIUM 2 % EX CREA: 1.0000 "application " | TOPICAL_CREAM | Freq: Two times a day (BID) | CUTANEOUS | 0 refills | Status: DC

## 2019-03-20 NOTE — ED Triage Notes (Signed)
Pt presents with laceration to left thumb after cutting a ham.

## 2019-03-20 NOTE — ED Provider Notes (Addendum)
[facility]    CSN: [id number] Arrival date & time: 03/20/19  1854      History   Chief Complaint Chief Complaint  Patient presents with  . Laceration    HPI Jeffery Beard is a 58 y.o. male.   The history is provided by the patient. No language interpreter was used.  Laceration Location:  Hand Hand laceration location:  L hand (left thumb) Length:  5 cm Depth:  Cutaneous Injury mechanism: cut from knife. Pain details:    Quality:  Aching   Severity:  Mild   Timing:  Constant   Progression:  Unchanged Foreign body present:  No foreign bodies Relieved by:  Nothing Worsened by:  Nothing Ineffective treatments:  None tried Tetanus status:  Up to date Associated symptoms: redness   Associated symptoms: no fever, no focal weakness, no numbness and no swelling     Past Medical History:  Diagnosis Date  . Atrial fibrillation (HCC)    Details unclear - patient states diagnosed in his 42s  . Bicuspid aortic valve   . Hyperlipidemia   . Sarcoidosis   . Type 2 diabetes mellitus [facility])     Patient Active Problem List   Diagnosis Date Noted  . LADA (latent autoimmune diabetes in adults), managed as type 1 (HCC) 09/06/2018  . Hypothyroidism 09/06/2018  . Bicuspid aortic valve   . Nonrheumatic aortic valve stenosis   . Thoracic aortic aneurysm without rupture (HCC)   . Essential hypertension   . Sarcoidosis   . Atrial fibrillation with RVR (HCC) 08/14/2018  . Mixed hyperlipidemia 03/24/2018  . Current smoker 03/24/2018    Past Surgical History:  Procedure Laterality Date  . LUNG BIOPSY    . Nasal septum surgery    . PALATE / UVULA BIOPSY / EXCISION    . TONSILLECTOMY    . VASECTOMY         Home Medications    Prior to Admission medications   Medication Sig Start Date End Date Taking? Authorizing Provider  acetaminophen (TYLENOL) 650 MG CR tablet Take 650 mg by mouth every 8 (eight) hours as needed for pain.    [provider]    apixaban (ELIQUIS) 5 MG TABS tablet Take 1 tablet (5 mg total) by mouth 2 (two) times daily. 01/13/19 02/12/19  Jonelle Sidle, MD  Continuous Blood Gluc Sensor (FREESTYLE LIBRE 2 SENSOR SYSTM) MISC 1 Piece by Does not apply route 2 (two) times a week. 12/19/18   Roma Kayser, MD  diltiazem (CARDIZEM CD) 120 MG 24 hr capsule Take 1 capsule (120 mg total) by mouth daily. 08/16/18 08/16/19  Sherryll Burger, Pratik D, DO  glucose blood (ONETOUCH VERIO) test strip Use as instructed 4 x daily 09/08/18   Roma Kayser, MD  ibuprofen (ADVIL) 200 MG tablet Take 400-600 mg by mouth every 6 (six) hours as needed.    [provider]  Insulin Glargine, 1 Unit Dial, (TOUJEO SOLOSTAR) 300 UNIT/ML SOPN Inject 66 Units into the skin at bedtime. 12/16/18   Roma Kayser, MD  Insulin Lispro (HUMALOG KWIKPEN) 200 UNIT/ML SOPN Inject 8-11 Units into the skin 3 (three) times daily before meals. 09/06/18   Roma Kayser, MD  levothyroxine (SYNTHROID) 25 MCG tablet Take 1 tablet (25 mcg total) by mouth daily before breakfast. 09/06/18   Nida, Denman George, MD  lisinopril (ZESTRIL) 10 MG tablet TAKE 1 TABLET(10 MG) BY MOUTH DAILY 09/30/18   Kerin Perna, MD  mupirocin cream Idelle Jo)  2 % Apply 1 application topically 2 (two) times daily. 03/20/19   Maryfrances Portugal, Darrelyn Hillock, FNP  nitroGLYCERIN (NITROSTAT) 0.4 MG SL tablet Place 1 tablet (0.4 mg total) under the tongue every 5 (five) minutes as needed for chest pain. 02/08/19 05/09/19  Satira Sark, MD    Family History Family History  Problem Relation Age of Onset  . COPD Mother   . High blood pressure Mother   . Cancer - Prostate Father   . High blood pressure Father     Social History Social History   Tobacco Use  . Smoking status: Current Every Day Smoker    Packs/day: 1.00    Years: 25.00    Pack years: 25.00    Types: Cigarettes  . Smokeless tobacco: Never Used  Substance Use Topics  . Alcohol use: Never  . Drug use: Never      Allergies   Codeine and Hydrocodone-acetaminophen   Review of Systems Review of Systems  Constitutional: Negative.  Negative for fever.  HENT: Negative.   Respiratory: Negative.   Cardiovascular: Negative.   Skin: Negative.   Neurological: Negative for focal weakness.  ROS: All other are negatives  Physical Exam Triage Vital Signs ED Triage Vitals  Enc Vitals Group     BP 03/20/19 1933 136/84     Pulse Rate 03/20/19 1933 73     Resp 03/20/19 1933 18     Temp 03/20/19 1933 98.4 F (36.9 C)     Temp src --      SpO2 03/20/19 1933 95 %     Weight --      Height --      Head Circumference --      Peak Flow --      Pain Score 03/20/19 1931 2     Pain Loc --      Pain Edu? --      Excl. in [place]? --    No data found.  Updated Vital Signs BP 136/84   Pulse 73   Temp 98.4 F (36.9 C)   Resp 18   SpO2 95%   Visual Acuity Right Eye Distance:   Left Eye Distance:   Bilateral Distance:    Right Eye Near:   Left Eye Near:    Bilateral Near:     Physical Exam Vitals and nursing note reviewed.  Constitutional:      General: He is not in acute distress.    Appearance: Normal appearance. He is not ill-appearing.  Cardiovascular:     Rate and Rhythm: Normal rate.     Pulses: Normal pulses.     Heart sounds: Normal heart sounds. No murmur.  Pulmonary:     Effort: Pulmonary effort is normal. No respiratory distress.     Breath sounds: Normal breath sounds.  Chest:     Chest wall: No tenderness.  Skin:    General: Skin is warm.     Coloration: Skin is not jaundiced or pale.     Findings: No erythema.  Neurological:     Mental Status: He is alert.   03/20/19     UC Treatments / Results  Labs (all labs ordered are listed, but only abnormal results are displayed) Labs Reviewed - No data to display  EKG   Radiology No results found.  Procedures Procedures (including critical care time)  Medications Ordered in UC Medications - No data to  display  Initial Impression / Assessment and Plan / UC Course  I have  reviewed the triage vital signs and the nursing notes.  Pertinent labs & imaging results that were available during my care of the patient were reviewed by me and considered in my medical decision making (see chart for details).   Patient stable for discharge.  Benign physical exam.  Bactroban applied in office.  Advised patient to return if symptoms get worse   Final Clinical Impressions(s) / UC Diagnoses   Final diagnoses:  Laceration of left thumb without foreign body without damage to nail, initial encounter     Discharge Instructions     Keep it covered and dry for next 24-48 hours.  After than you may gently clean with warm water and mild soap.  Change dressing daily and apply a thin layer of Bactroban Take OTC ibuprofen or tylenol as needed for pain releif Return sooner or go to the ED if you have any new or worsening symptoms such as increased pain, redness, swelling, drainage, discharge, decreased range of motion of extremity, etc..      ED Prescriptions    Medication Sig Dispense Auth. Provider   mupirocin cream (BACTROBAN) 2 % Apply 1 application topically 2 (two) times daily. 15 g Durward ParcelAvegno, Dejai Schubach S, FNP     PDMP not reviewed this encounter.   Durward ParcelAvegno, Edem Tiegs S, FNP 03/20/19 2039    Durward ParcelAvegno, Sherley Mckenney S, FNP 03/20/19 2316

## 2019-03-20 NOTE — Discharge Instructions (Addendum)
Keep it covered and dry for next 24-48 hours.  After than you may gently clean with warm water and mild soap.  Change dressing daily and apply a thin layer of Bactroban Take OTC ibuprofen or tylenol as needed for pain releif Return sooner or go to the ED if you have any new or worsening symptoms such as increased pain, redness, swelling, drainage, discharge, decreased range of motion of extremity, etc..

## 2019-03-21 ENCOUNTER — Telehealth: Payer: Self-pay | Admitting: Urgent Care

## 2019-03-21 MED ORDER — MUPIROCIN 2 % EX OINT: 1.0000 "application " | TOPICAL_OINTMENT | Freq: Three times a day (TID) | CUTANEOUS | 0 refills | Status: DC

## 2019-03-21 NOTE — Telephone Encounter (Signed)
Patient called needing script be corrected for bacitracin. Script for cream was sent and is very expensive. Will send script for ointment instead.

## 2019-03-22 ENCOUNTER — Ambulatory Visit: Payer: Managed Care, Other (non HMO) | Admitting: Cardiothoracic Surgery

## 2019-03-22 ENCOUNTER — Inpatient Hospital Stay: Admission: RE | Admit: 2019-03-22 | Payer: Managed Care, Other (non HMO) | Source: Ambulatory Visit

## 2019-03-24 ENCOUNTER — Ambulatory Visit: Payer: Managed Care, Other (non HMO) | Admitting: Cardiothoracic Surgery

## 2019-04-03 ENCOUNTER — Other Ambulatory Visit: Payer: Self-pay

## 2019-04-03 MED ORDER — TOUJEO SOLOSTAR 300 UNIT/ML ~~LOC~~ SOPN: 66.0000 [IU] | PEN_INJECTOR | Freq: Every day | SUBCUTANEOUS | 1 refills | Status: DC

## 2019-04-04 ENCOUNTER — Other Ambulatory Visit: Payer: Self-pay

## 2019-04-04 MED ORDER — TOUJEO SOLOSTAR 300 UNIT/ML ~~LOC~~ SOPN: 66.0000 [IU] | PEN_INJECTOR | Freq: Every day | SUBCUTANEOUS | 1 refills | Status: DC

## 2019-04-13 ENCOUNTER — Other Ambulatory Visit: Payer: Self-pay | Admitting: "Endocrinology

## 2019-04-19 ENCOUNTER — Ambulatory Visit: Payer: Managed Care, Other (non HMO) | Admitting: "Endocrinology

## 2019-04-27 ENCOUNTER — Other Ambulatory Visit: Payer: Managed Care, Other (non HMO)

## 2019-06-26 ENCOUNTER — Telehealth: Payer: Self-pay | Admitting: Cardiology

## 2019-06-26 NOTE — Telephone Encounter (Signed)
Noted.  I am glad that he is back on his regular medications, this may be why he is feeling better since I suspect his heart rate is better controlled on diltiazem.  If he would like to come by the office for an ECG we could assess rhythm, but I suspect that we will continue with medical therapy since he is already anticoagulated and has a known history of paroxysmal atrial arrhythmias.

## 2019-06-26 NOTE — Telephone Encounter (Signed)
Pt states that he feels that he is back into afib. He ran out of diltiazem and lisinopril. Pt states that he did have Rx filled on Sunday. Reports that his SOB and fatigue did decrease after that. He was able to work in his yard on Sunday. But he still feels that he is in Afib. Pt states he was with out diltiazem for one week and and lisinopril for only a few days. Please advise.

## 2019-06-26 NOTE — Telephone Encounter (Signed)
Pt called stating he's been in Afib since Saturday  Please call [phone]

## 2019-06-26 NOTE — Telephone Encounter (Signed)
Pt notified of Dr. Diona Browner instructions. Pt voiced understanding and states that he will like to have done at PCP office on today. If unable to pt states he will come to our office.

## 2019-06-27 ENCOUNTER — Emergency Department (HOSPITAL_COMMUNITY): Payer: Managed Care, Other (non HMO)

## 2019-06-27 ENCOUNTER — Encounter (HOSPITAL_COMMUNITY): Payer: Self-pay | Admitting: Emergency Medicine

## 2019-06-27 ENCOUNTER — Other Ambulatory Visit: Payer: Self-pay

## 2019-06-27 ENCOUNTER — Observation Stay (HOSPITAL_COMMUNITY)
Admission: EM | Admit: 2019-06-27 | Discharge: 2019-06-28 | Disposition: A | Discharge status: Home / Self Care | Payer: Managed Care, Other (non HMO) | Attending: Family Medicine | Admitting: Family Medicine

## 2019-06-27 DIAGNOSIS — E139 Other specified diabetes mellitus without complications: Secondary | ICD-10-CM | POA: Insufficient documentation

## 2019-06-27 DIAGNOSIS — E039 Hypothyroidism, unspecified: Secondary | ICD-10-CM | POA: Diagnosis not present

## 2019-06-27 DIAGNOSIS — Z20822 Contact with and (suspected) exposure to covid-19: Secondary | ICD-10-CM | POA: Diagnosis not present

## 2019-06-27 DIAGNOSIS — I712 Thoracic aortic aneurysm, without rupture, unspecified: Secondary | ICD-10-CM | POA: Diagnosis present

## 2019-06-27 DIAGNOSIS — Q231 Congenital insufficiency of aortic valve: Secondary | ICD-10-CM | POA: Diagnosis not present

## 2019-06-27 DIAGNOSIS — Z794 Long term (current) use of insulin: Secondary | ICD-10-CM | POA: Insufficient documentation

## 2019-06-27 DIAGNOSIS — Z79899 Other long term (current) drug therapy: Secondary | ICD-10-CM | POA: Diagnosis not present

## 2019-06-27 DIAGNOSIS — F17210 Nicotine dependence, cigarettes, uncomplicated: Secondary | ICD-10-CM | POA: Insufficient documentation

## 2019-06-27 DIAGNOSIS — I4891 Unspecified atrial fibrillation: Principal | ICD-10-CM | POA: Insufficient documentation

## 2019-06-27 DIAGNOSIS — I480 Paroxysmal atrial fibrillation: Secondary | ICD-10-CM | POA: Insufficient documentation

## 2019-06-27 DIAGNOSIS — R0602 Shortness of breath: Secondary | ICD-10-CM | POA: Insufficient documentation

## 2019-06-27 DIAGNOSIS — Z7901 Long term (current) use of anticoagulants: Secondary | ICD-10-CM | POA: Diagnosis not present

## 2019-06-27 DIAGNOSIS — I10 Essential (primary) hypertension: Secondary | ICD-10-CM | POA: Insufficient documentation

## 2019-06-27 LAB — CBC WITH DIFFERENTIAL/PLATELET
Abs Immature Granulocytes: 0.02 10*3/uL (ref 0.00–0.07)
Basophils Absolute: 0.1 10*3/uL (ref 0.0–0.1)
Basophils Relative: 1 %
Eosinophils Absolute: 0.2 10*3/uL (ref 0.0–0.5)
Eosinophils Relative: 3 %
HCT: 45.3 % (ref 39.0–52.0)
Hemoglobin: 15 g/dL (ref 13.0–17.0)
Immature Granulocytes: 0 %
Lymphocytes Relative: 25 %
Lymphs Abs: 1.9 10*3/uL (ref 0.7–4.0)
MCH: 30.5 pg (ref 26.0–34.0)
MCHC: 33.1 g/dL (ref 30.0–36.0)
MCV: 92.1 fL (ref 80.0–100.0)
Monocytes Absolute: 0.5 10*3/uL (ref 0.1–1.0)
Monocytes Relative: 7 %
Neutro Abs: 4.9 10*3/uL (ref 1.7–7.7)
Neutrophils Relative %: 64 %
Platelets: 162 10*3/uL (ref 150–400)
RBC: 4.92 MIL/uL (ref 4.22–5.81)
RDW: 12.7 % (ref 11.5–15.5)
WBC: 7.6 10*3/uL (ref 4.0–10.5)
nRBC: 0 % (ref 0.0–0.2)

## 2019-06-27 LAB — COMPREHENSIVE METABOLIC PANEL
ALT: 14 U/L (ref 0–44)
AST: 14 U/L — ABNORMAL LOW (ref 15–41)
Albumin: 3.7 g/dL (ref 3.5–5.0)
Alkaline Phosphatase: 45 U/L (ref 38–126)
Anion gap: 7 (ref 5–15)
BUN: 11 mg/dL (ref 6–20)
CO2: 25 mmol/L (ref 22–32)
Calcium: 8.8 mg/dL — ABNORMAL LOW (ref 8.9–10.3)
Chloride: 105 mmol/L (ref 98–111)
Creatinine, Ser: 0.8 mg/dL (ref 0.61–1.24)
GFR calc Af Amer: >60 mL/min (ref >60)
GFR calc non Af Amer: >60 mL/min (ref >60)
Glucose, Bld: 283 mg/dL — ABNORMAL HIGH (ref 70–99)
Potassium: 3.6 mmol/L (ref 3.5–5.1)
Sodium: 137 mmol/L (ref 135–145)
Total Bilirubin: 0.5 mg/dL (ref 0.3–1.2)
Total Protein: 6.5 g/dL (ref 6.5–8.1)

## 2019-06-27 LAB — URINALYSIS, ROUTINE W REFLEX MICROSCOPIC
Bilirubin Urine: NEGATIVE
Glucose, UA: 150 mg/dL — AB
Hgb urine dipstick: NEGATIVE
Ketones, ur: NEGATIVE mg/dL
Leukocytes,Ua: NEGATIVE
Nitrite: NEGATIVE
Protein, ur: NEGATIVE mg/dL
Specific Gravity, Urine: 1.003 — ABNORMAL LOW (ref 1.005–1.030)
pH: 6 (ref 5.0–8.0)

## 2019-06-27 LAB — RESPIRATORY PANEL BY RT PCR (FLU A&B, COVID)
Influenza A by PCR: NEGATIVE
Influenza B by PCR: NEGATIVE
SARS Coronavirus 2 by RT PCR: NEGATIVE

## 2019-06-27 LAB — TROPONIN I (HIGH SENSITIVITY)
Troponin I (High Sensitivity): 2 ng/L (ref <18)
Troponin I (High Sensitivity): 3 ng/L (ref <18)

## 2019-06-27 LAB — GLUCOSE, CAPILLARY: Glucose-Capillary: 127 mg/dL — ABNORMAL HIGH (ref 70–99)

## 2019-06-27 MED ORDER — DILTIAZEM HCL 25 MG/5ML IV SOLN: 10.0000 mg | INTRAVENOUS | Status: DC | PRN

## 2019-06-27 MED ORDER — INSULIN ASPART 100 UNIT/ML ~~LOC~~ SOLN: 0.0000 [IU] | Freq: Three times a day (TID) | SUBCUTANEOUS | Status: DC

## 2019-06-27 MED ORDER — SODIUM CHLORIDE 0.9 % IV SOLN: INTRAVENOUS | Status: DC

## 2019-06-27 MED ORDER — LISINOPRIL 10 MG PO TABS
10.0000 mg | ORAL_TABLET | Freq: Every day | ORAL | Status: DC
  Administered 2019-06-28: 10 mg via ORAL
  Filled 2019-06-27: qty 1

## 2019-06-27 MED ORDER — APIXABAN 5 MG PO TABS
5.0000 mg | ORAL_TABLET | Freq: Two times a day (BID) | ORAL | Status: DC
  Administered 2019-06-28: 10:00:00 5 mg via ORAL
  Filled 2019-06-27: qty 1

## 2019-06-27 MED ORDER — ONDANSETRON HCL 4 MG PO TABS: 4.0000 mg | ORAL_TABLET | Freq: Four times a day (QID) | ORAL | Status: DC | PRN

## 2019-06-27 MED ORDER — ONDANSETRON HCL 4 MG/2ML IJ SOLN: 4.0000 mg | Freq: Four times a day (QID) | INTRAMUSCULAR | Status: DC | PRN

## 2019-06-27 MED ORDER — NITROGLYCERIN 0.4 MG SL SUBL: 0.4000 mg | SUBLINGUAL_TABLET | SUBLINGUAL | Status: DC | PRN

## 2019-06-27 MED ORDER — LEVOTHYROXINE SODIUM 25 MCG PO TABS
25.0000 ug | ORAL_TABLET | Freq: Every day | ORAL | Status: DC
  Filled 2019-06-27: qty 1

## 2019-06-27 MED ORDER — HYDRALAZINE HCL 20 MG/ML IJ SOLN: 5.0000 mg | INTRAMUSCULAR | Status: DC | PRN

## 2019-06-27 MED ORDER — IOHEXOL 350 MG/ML SOLN
100.0000 mL | Freq: Once | INTRAVENOUS | Status: AC | PRN
  Administered 2019-06-27: 19:00:00 100 mL via INTRAVENOUS

## 2019-06-27 NOTE — ED Notes (Signed)
Pt is aware we need urine sample, urinal at bedside.  

## 2019-06-27 NOTE — ED Provider Notes (Signed)
[facility] EMERGENCY DEPARTMENT Provider Note   CSN: [id number] Arrival date & time: 06/27/19  1546     History Chief Complaint  Patient presents with  . Atrial Fibrillation    Jeffery Beard is a 59 y.o. male with PMHx HTN, HLD, paroxysmal A fib on Eliquis, type 2 diabetes, sarcoidosis who presents to the ED today for a fib.  Patient reports he recently ran out of his diltiazem about 1 week ago.  He states that 4 days ago he began feeling short of breath and like his heart was skipping beats.  Patient went to see his PCP yesterday and had an EKG done which showed that he was in A. fib.  He was represcribed his diltiazem and started on 30 mg Dilts as well.  He states he took 1 dose yesterday but was still in A. fib today.  He took his extended release 120 mg diltiazem this morning around 8 AM and then took the 30 mg diltiazem around 12 PM today.  She reports that due to him still being in A. fib he was told to come to the ED for further evaluation.  He does report he had an episode of chest pain that lasted a couple of seconds 3 days ago.  Denies any chest pain currently.  Denies fevers, chills, cough, nausea, vomiting, leg swelling, any other associated symptoms.  Patient is currently on Eliquis and states he may have missed a dose before all of this started.   The history is provided by the patient and medical records.       Past Medical History:  Diagnosis Date  . Atrial fibrillation (HCC)    Details unclear - patient states diagnosed in his 71s  . Bicuspid aortic valve   . Hyperlipidemia   . Sarcoidosis   . Type 2 diabetes mellitus [facility])     Patient Active Problem List   Diagnosis Date Noted  . LADA (latent autoimmune diabetes in adults), managed as type 1 (HCC) 09/06/2018  . Hypothyroidism 09/06/2018  . Bicuspid aortic valve   . Nonrheumatic aortic valve stenosis   . Thoracic aortic aneurysm without rupture (HCC)   . Essential hypertension   . Sarcoidosis   . Atrial  fibrillation with RVR (HCC) 08/14/2018  . Mixed hyperlipidemia 03/24/2018  . Current smoker 03/24/2018    Past Surgical History:  Procedure Laterality Date  . LUNG BIOPSY    . Nasal septum surgery    . PALATE / UVULA BIOPSY / EXCISION    . TONSILLECTOMY    . VASECTOMY         Family History  Problem Relation Age of Onset  . COPD Mother   . High blood pressure Mother   . Cancer - Prostate Father   . High blood pressure Father     Social History   Tobacco Use  . Smoking status: Current Every Day Smoker    Packs/day: 1.00    Years: 25.00    Pack years: 25.00    Types: Cigarettes  . Smokeless tobacco: Never Used  Substance Use Topics  . Alcohol use: Never  . Drug use: Never    Home Medications Prior to Admission medications   Medication Sig Start Date End Date Taking? Authorizing Provider  acetaminophen (TYLENOL) 650 MG CR tablet Take 650 mg by mouth every 8 (eight) hours as needed for pain.    [provider]  apixaban (ELIQUIS) 5 MG TABS tablet Take 1 tablet (5 mg total) by mouth 2 (  two) times daily. 01/13/19 02/12/19  Satira Sark, MD  Continuous Blood Gluc Sensor (FREESTYLE LIBRE 2 SENSOR SYSTM) MISC CHANGE SENSOR EVERY 14 DAYS 04/13/19   Cassandria Anger, MD  diltiazem (CARDIZEM CD) 120 MG 24 hr capsule Take 1 capsule (120 mg total) by mouth daily. 08/16/18 08/16/19  Manuella Ghazi, Pratik D, DO  glucose blood (ONETOUCH VERIO) test strip Use as instructed 4 x daily 09/08/18   Cassandria Anger, MD  ibuprofen (ADVIL) 200 MG tablet Take 400-600 mg by mouth every 6 (six) hours as needed.    [provider]  Insulin Glargine, 1 Unit Dial, (TOUJEO SOLOSTAR) 300 UNIT/ML SOPN Inject 66 Units into the skin at bedtime. 04/04/19   Cassandria Anger, MD  Insulin Lispro (HUMALOG KWIKPEN) 200 UNIT/ML SOPN Inject 8-11 Units into the skin 3 (three) times daily before meals. 09/06/18   Cassandria Anger, MD  levothyroxine (SYNTHROID) 25 MCG tablet Take 1 tablet  (25 mcg total) by mouth daily before breakfast. 09/06/18   Nida, Marella Chimes, MD  lisinopril (ZESTRIL) 10 MG tablet TAKE 1 TABLET(10 MG) BY MOUTH DAILY 09/30/18   [place] Gum, Collier Salina, MD  mupirocin ointment (BACTROBAN) 2 % Apply 1 application topically 3 (three) times daily. 03/21/19   Jaynee Eagles, PA-C  nitroGLYCERIN (NITROSTAT) 0.4 MG SL tablet Place 1 tablet (0.4 mg total) under the tongue every 5 (five) minutes as needed for chest pain. 02/08/19 05/09/19  Satira Sark, MD    Allergies    Codeine and Hydrocodone-acetaminophen  Review of Systems   Review of Systems  Constitutional: Negative for chills and fever.  Respiratory: Positive for shortness of breath.   Cardiovascular: Positive for chest pain and palpitations.  All other systems reviewed and are negative.   Physical Exam Updated Vital Signs BP (!) 144/102   Pulse 96   Temp 98.1 F (36.7 C) (Oral)   Resp 10   Ht 6' (1.829 m)   Wt 73.9 kg   SpO2 100%   BMI 22.11 kg/m   Physical Exam Vitals and nursing note reviewed.  Constitutional:      Appearance: He is not ill-appearing or diaphoretic.  HENT:     Head: Normocephalic and atraumatic.  Eyes:     Conjunctiva/sclera: Conjunctivae normal.  Cardiovascular:     Rate and Rhythm: Rhythm irregularly irregular.  Pulmonary:     Effort: Pulmonary effort is normal.     Breath sounds: Normal breath sounds. No wheezing, rhonchi or rales.  Abdominal:     Palpations: Abdomen is soft.     Tenderness: There is no abdominal tenderness. There is no guarding or rebound.  Musculoskeletal:     Cervical back: Neck supple.  Skin:    General: Skin is warm and dry.  Neurological:     Mental Status: He is alert.     ED Results / Procedures / Treatments   Labs (all labs ordered are listed, but only abnormal results are displayed) Labs Reviewed  COMPREHENSIVE METABOLIC PANEL - Abnormal; Notable for the following components:      Result Value   Glucose, Bld 283 (*)    Calcium  8.8 (*)    AST 14 (*)    All other components within normal limits  URINALYSIS, ROUTINE W REFLEX MICROSCOPIC - Abnormal; Notable for the following components:   Color, Urine COLORLESS (*)    Specific Gravity, Urine 1.003 (*)    Glucose, UA 150 (*)    All other components within normal limits  RESPIRATORY PANEL BY RT PCR (FLU A&B, COVID)  CBC WITH DIFFERENTIAL/PLATELET  TROPONIN I (HIGH SENSITIVITY)  TROPONIN I (HIGH SENSITIVITY)    EKG None  Radiology DG Chest 2 View  Result Date: 06/27/2019 CLINICAL DATA:  Atrial fibrillation. Shortness of breath. Sarcoidosis. EXAM: CHEST - 2 VIEW COMPARISON:  Multiple exams, including chest CT from 08/14/2018 in chest radiograph from the same date. FINDINGS: Emphysema. Stable 1.2 cm right upper lobe pulmonary nodule partially obscured by ECG lead. No adenopathy identified. Heart size within normal limits. No pulmonary edema. IMPRESSION: 1. Emphysema. 2. Stable right upper lobe pulmonary nodule. 3. No acute findings. Electronically Signed   By: Gaylyn Rong M.D.   On: 06/27/2019 17:47   CT Angio Chest PE W/Cm &/Or Wo Cm  Result Date: 06/27/2019 CLINICAL DATA:  Shortness of breath and atrial fibrillation. History of sarcoidosis EXAM: CT ANGIOGRAPHY CHEST WITH CONTRAST TECHNIQUE: Multidetector CT imaging of the chest was performed using the standard protocol during bolus administration of intravenous contrast. Multiplanar CT image reconstructions and MIPs were obtained to evaluate the vascular anatomy. CONTRAST:  OMNIPAQUE IOHEXOL 350 MG/ML SOLN COMPARISON:  Chest radiograph June 27, 2019; chest CT Aug 14, 2018; PET-CT October 05, 2018 FINDINGS: Cardiovascular: There is no demonstrable pulmonary embolus. There is again noted prominence of the ascending thoracic aorta with a measured diameter of 4.9 x 4.8 cm. There are occasional scattered foci of calcification in visualized great vessels. There is aortic atherosclerosis. Slight coronary artery  calcification noted. No pericardial effusion or pericardial thickening evident. Mediastinum/Nodes: Visualized thyroid appears normal. There are multiple subcentimeter axillary lymph nodes. There is no evident adenopathy by size criteria in the thoracic region. No esophageal lesions are evident. Lungs/Pleura: There are areas of scarring in the right upper lobe with small bullae. There is a stable nodular opacity in the posterior segment of the right upper lobe measuring 1.4 x 1.3 cm. There are scattered bullae in the lower lobe regions. No new nodular opacities are evident. There is no edema or airspace opacification. No pleural effusions are evident. Upper Abdomen: Foci of upper abdominal aortic atherosclerosis and splenic artery calcification noted. Visualized upper abdominal structures otherwise appear unremarkable. Musculoskeletal: No blastic or lytic bone lesions. No evident chest wall lesions. Review of the MIP images confirms the above findings. IMPRESSION: 1.  No demonstrable pulmonary embolus. 2. Ascending thoracic aortic diameter of 4.9 x 4.8 cm, stable. There are foci of aortic atherosclerosis. No dissection. Slight coronary artery calcification noted. Ascending thoracic aortic aneurysm. Recommend semi-annual imaging followup by CTA or MRA and referral to cardiothoracic surgery if not already obtained. This recommendation follows 2010 ACCF/AHA/AATS/ACR/ASA/SCA/SCAI/SIR/STS/SVM Guidelines for the Diagnosis and Management of Patients With Thoracic Aortic Disease. Circulation. 2010; 121: V956-L875. Aortic aneurysm NOS (ICD10-I71.9). 3. Stable nodular opacities in the right upper lobe, largest measuring 1.4 x 1.3 cm. Question residua of sarcoidosis. No new nodular opacities evident. Underlying areas of scarring and bullous disease. No edema or airspace opacity. 4.  No demonstrable thoracic adenopathy. Aortic Atherosclerosis (ICD10-I70.0). Electronically Signed   By: Bretta Bang III M.D.   On: 06/27/2019  19:16    Procedures Procedures (including critical care time)  Medications Ordered in ED Medications  iohexol (OMNIPAQUE) 350 MG/ML injection 100 mL (100 mLs Intravenous Contrast Given 06/27/19 1859)    ED Course  I have reviewed the triage vital signs and the nursing notes.  Pertinent labs & imaging results that were available during my care of the patient were reviewed by me and  considered in my medical decision making (see chart for details).  Clinical Course as of Jun 26 1941  Tue Jun 27, 2019  1848 Admit. TEE cardioversion. NPO after midnight. 120 mg cardizem continue tomorrow morning. PRN short acting if HR increases.    [MV]    Clinical Course User Index [MV] Tanda Rockers, PA-C   MDM Rules/Calculators/A&P                      59 year old male who presents to the ED today complaining of shortness of breath for the past 4 days.  Has been in A. fib since then.  History of paroxysmal A. fib.  Has been out of his diltiazem for about 1 week for his symptoms started as well as his lisinopril.  Typically takes 120 mg of diltiazem and 10 milligrams of lisinopril.  Rival to the ED patient is afebrile, nontachycardic and nontachypneic.  Heart rate will fluctuate in the 90s to the low 100s however during my exam.  Ports feeling short of breath however is nontachypneic.  He is satting 100% on room air and able to speak in full sentences without difficulty.  Does report he has been taking 120 mg diltiazem extended release since yesterday.  He has also been given 30 mg diltiazem immediate release taken every 6 hours as needed for his symptoms that started at 6 PM yesterday.  He has had 4 doses of this with most recent 1 being at 12 PM today.  She is still in A. fib on our EKG.  Had some chest pain in the past couple of days as well.  Will obtain labs at this time, chest x-ray and reevaluate.  Will likely need to discuss with cardiology given the amount of diltiazem the patient has been taking.     CBC without leukocytosis.  Hemoglobin is stable.  CMP with glucose 283, no other electrolyte abnormalities.No anion gap.  U/A without infection Troponin of 3   CXR with emphysema and stable pulmonary nodule.   On reevaluation pt's heart rate has remained stable in the 80's to 90's. Blood pressure slightly hypotensive; will continue to monitor. On further assessment pt endorses he has been taking his Eliquis about once a day but will sometimes miss days. He reports however since he began feeling short of breath 4 days ago he has been good about taking it twice a day. Will add on CTA to rule out PE.   CTA negative for PE.   Discussed case with pt's cardiologist Dr. Diona Browner who recommends admission at this time given pt still in a fib despite outpatient efforts. He recommends TEE cardioversion in the morning given pt has been noncompliant with his anticoagulation. He recommends continuing home dose of extended release diltiazem 120 mg in the morning and short acting diltiazem PRN if HR increases. Will swab for COVID at this time and admit to medicine.   Discussed case with [facility] Dr. Margot Ables who will come evaluate patient for admission.   This note was prepared using Dragon voice recognition software and may include unintentional dictation errors due to the inherent limitations of voice recognition software.  Final Clinical Impression(s) / ED Diagnoses Final diagnoses:  Paroxysmal atrial fibrillation (HCC)  Shortness of breath    Rx / DC Orders ED Discharge Orders    None       Tanda Rockers, PA-C 06/27/19 1943    Donnetta Hutching, MD 06/28/19 2312

## 2019-06-27 NOTE — ED Triage Notes (Signed)
New onset afib on Saturday. Saw dr hall yesterday, given diltiazem 30 mg but no relief.

## 2019-06-27 NOTE — H&P (Addendum)
History and Physical    Jeffery Beard OEU:[id number] DOB: 1960/09/17 DOA: 06/27/2019  PCP: Benita Stabile, MD Patient coming from: Home  Chief Complaint: Afib  HPI: Jeffery Beard is a 59 y.o. male with medical history significant of Afib w/ intermittent medication use, bicuspid aortic valve, aortic aneurysm, HLD, sarcoidosis, and brittle diabetes.   Pt reports running out of his dilt and Lisinopril 1 wk ago. 4 days ago he began feeling palpitations and mild SOB. Pt knew he was in Afib at that point. Seen by cards 1 day ago who resumed pts medications. HOwever pt has bene unable to get back into sinus and continued to be tachycardic. No CP, diaphoresis, DOE. He reports being told to come to the ED for admission for TEE and cardioversion. Pt reports taking  His Eliquis most of the time.     ED Course: objective findings below. At time of coming to Ed pt had taken 240mg  of Dilt in past 24 hrs so was rate controlled at the time of ED presentation but still in Afib.   Review of Systems: As per HPI otherwise all other systems reviewed and are negative  Ambulatory Status:No restrictions  Past Medical History:  Diagnosis Date  . Atrial fibrillation (HCC)    Details unclear - patient states diagnosed in his 8s  . Bicuspid aortic valve   . Hyperlipidemia   . Sarcoidosis   . Type 2 diabetes mellitus (HCC)     Past Surgical History:  Procedure Laterality Date  . LUNG BIOPSY    . Nasal septum surgery    . PALATE / UVULA BIOPSY / EXCISION    . TONSILLECTOMY    . VASECTOMY      Social History   Socioeconomic History  . Marital status: Married    Spouse name: Not on file  . Number of children: Not on file  . Years of education: Not on file  . Highest education level: Not on file  Occupational History  . Not on file  Tobacco Use  . Smoking status: Current Every Day Smoker    Packs/day: 1.00    Years: 25.00    Pack years: 25.00    Types: Cigarettes  . Smokeless tobacco:  Never Used  Substance and Sexual Activity  . Alcohol use: Never  . Drug use: Never  . Sexual activity: Not on file  Other Topics Concern  . Not on file  Social History Narrative  . Not on file   Social Determinants of Health   Financial Resource Strain:   . Difficulty of Paying Living Expenses:   Food Insecurity:   . Worried About 38s in the Last Year:   . Programme researcher, broadcasting/film/video in the Last Year:   Transportation Needs:   . Barista (Medical):   Freight forwarder Lack of Transportation (Non-Medical):   Physical Activity:   . Days of Exercise per Week:   . Minutes of Exercise per Session:   Stress:   . Feeling of Stress :   Social Connections:   . Frequency of Communication with Friends and Family:   . Frequency of Social Gatherings with Friends and Family:   . Attends Religious Services:   . Active Member of Clubs or Organizations:   . Attends [employer] Meetings:   Banker Marital Status:   Intimate Partner Violence:   . Fear of Current or Ex-Partner:   . Emotionally Abused:   [employer] Physically Abused:   . Sexually Abused:  Allergies  Allergen Reactions  . Codeine   . Hydrocodone-Acetaminophen     Other reaction(s): Other (See Comments) Nervousness and itching    Family History  Problem Relation Age of Onset  . COPD Mother   . High blood pressure Mother   . Cancer - Prostate Father   . High blood pressure Father       Prior to Admission medications   Medication Sig Start Date End Date Taking? Authorizing Provider  acetaminophen (TYLENOL) 650 MG CR tablet Take 650 mg by mouth every 8 (eight) hours as needed for pain.    [provider]  apixaban (ELIQUIS) 5 MG TABS tablet Take 1 tablet (5 mg total) by mouth 2 (two) times daily. 01/13/19 02/12/19  Satira Sark, MD  Continuous Blood Gluc Sensor (FREESTYLE LIBRE 2 SENSOR SYSTM) MISC CHANGE SENSOR EVERY 14 DAYS 04/13/19   Cassandria Anger, MD  diltiazem (CARDIZEM CD) 120 MG 24 hr  capsule Take 1 capsule (120 mg total) by mouth daily. 08/16/18 08/16/19  Manuella Ghazi, Pratik D, DO  glucose blood (ONETOUCH VERIO) test strip Use as instructed 4 x daily 09/08/18   Cassandria Anger, MD  ibuprofen (ADVIL) 200 MG tablet Take 400-600 mg by mouth every 6 (six) hours as needed.    [provider]  Insulin Glargine, 1 Unit Dial, (TOUJEO SOLOSTAR) 300 UNIT/ML SOPN Inject 66 Units into the skin at bedtime. 04/04/19   Cassandria Anger, MD  Insulin Lispro (HUMALOG KWIKPEN) 200 UNIT/ML SOPN Inject 8-11 Units into the skin 3 (three) times daily before meals. 09/06/18   Cassandria Anger, MD  levothyroxine (SYNTHROID) 25 MCG tablet Take 1 tablet (25 mcg total) by mouth daily before breakfast. 09/06/18   Nida, Marella Chimes, MD  lisinopril (ZESTRIL) 10 MG tablet TAKE 1 TABLET(10 MG) BY MOUTH DAILY 09/30/18   [place]cott Gum, Collier Salina, MD  mupirocin ointment (BACTROBAN) 2 % Apply 1 application topically 3 (three) times daily. 03/21/19   Jaynee Eagles, PA-C  nitroGLYCERIN (NITROSTAT) 0.4 MG SL tablet Place 1 tablet (0.4 mg total) under the tongue every 5 (five) minutes as needed for chest pain. 02/08/19 05/09/19  Satira Sark, MD    Physical Exam: Vitals:   06/27/19 1830 06/27/19 1901 06/27/19 1930 06/27/19 2030  BP: 109/79 96/72 123/86 (!) 136/102  Pulse: 93 83 60 78  Resp: 16 15 (!) 21 12  Temp:      TempSrc:      SpO2: 100% 99% 98% 100%  Weight:      Height:         General:  Appears calm and comfortable Eyes:  PERRL, EOMI, normal lids, iris ENT:  grossly normal hearing, lips & tongue, mmm Neck:  no LAD, masses or thyromegaly Cardiovascular:  Irregularly irregular, no m/r/g. No LE edema.  Respiratory:  CTA bilaterally, no w/r/r. Normal respiratory effort. Abdomen:  soft, ntnd, NABS Skin:  no rash or induration seen on limited exam Musculoskeletal:  grossly normal tone BUE/BLE, good ROM, no bony abnormality Psychiatric:  grossly normal mood and affect, speech fluent and  appropriate, AOx3 Neurologic:  CN 2-12 grossly intact, moves all extremities in coordinated fashion, sensation intact  Labs on Admission: I have personally reviewed following labs and imaging studies  CBC: Recent Labs  Lab 06/27/19 1627  WBC 7.6  NEUTROABS 4.9  HGB 15.0  HCT 45.3  MCV 92.1  PLT 546   Basic Metabolic Panel: Recent Labs  Lab 06/27/19 1627  NA 137  K 3.6  CL 105  CO2 25  GLUCOSE 283*  BUN 11  CREATININE 0.80  CALCIUM 8.8*   GFR: Estimated Creatinine Clearance: 105.2 mL/min (by C-G formula based on SCr of 0.8 mg/dL). Liver Function Tests: Recent Labs  Lab 06/27/19 1627  AST 14*  ALT 14  ALKPHOS 45  BILITOT 0.5  PROT 6.5  ALBUMIN 3.7   No results for input(s): LIPASE, AMYLASE in the last 168 hours. No results for input(s): AMMONIA in the last 168 hours. Coagulation Profile: No results for input(s): INR, PROTIME in the last 168 hours. Cardiac Enzymes: No results for input(s): CKTOTAL, CKMB, CKMBINDEX, TROPONINI in the last 168 hours. BNP (last 3 results) No results for input(s): PROBNP in the last 8760 hours. HbA1C: No results for input(s): HGBA1C in the last 72 hours. CBG: No results for input(s): GLUCAP in the last 168 hours. Lipid Profile: No results for input(s): CHOL, HDL, LDLCALC, TRIG, CHOLHDL, LDLDIRECT in the last 72 hours. Thyroid Function Tests: No results for input(s): TSH, T4TOTAL, FREET4, T3FREE, THYROIDAB in the last 72 hours. Anemia Panel: No results for input(s): VITAMINB12, FOLATE, FERRITIN, TIBC, IRON, RETICCTPCT in the last 72 hours. Urine analysis:    Component Value Date/Time   COLORURINE COLORLESS (A) 06/27/2019 1627   APPEARANCEUR CLEAR 06/27/2019 1627   LABSPEC 1.003 (L) 06/27/2019 1627   PHURINE 6.0 06/27/2019 1627   GLUCOSEU 150 (A) 06/27/2019 1627   HGBUR NEGATIVE 06/27/2019 1627   BILIRUBINUR NEGATIVE 06/27/2019 1627   KETONESUR NEGATIVE 06/27/2019 1627   PROTEINUR NEGATIVE 06/27/2019 1627   NITRITE  NEGATIVE 06/27/2019 1627   LEUKOCYTESUR NEGATIVE 06/27/2019 1627    Creatinine Clearance: Estimated Creatinine Clearance: 105.2 mL/min (by C-G formula based on SCr of 0.8 mg/dL).  Sepsis Labs: @LABRCNTIP (procalcitonin:4,lacticidven:4) ) Recent Results (from the past 240 hour(s))  Respiratory Panel by RT PCR (Flu A&B, Covid) - Nasopharyngeal Swab     Status: None   Collection Time: 06/27/19  7:11 PM   Specimen: Nasopharyngeal Swab  Result Value Ref Range Status   SARS Coronavirus 2 by RT PCR NEGATIVE NEGATIVE Final    Comment: (NOTE) SARS-CoV-2 target nucleic acids are NOT DETECTED. The SARS-CoV-2 RNA is generally detectable in upper respiratoy specimens during the acute phase of infection. The lowest concentration of SARS-CoV-2 viral copies this assay can detect is 131 copies/mL. A negative result does not preclude SARS-Cov-2 infection and should not be used as the sole basis for treatment or other patient management decisions. A negative result may occur with  improper specimen collection/handling, submission of specimen other than nasopharyngeal swab, presence of viral mutation(s) within the areas targeted by this assay, and inadequate number of viral copies (<131 copies/mL). A negative result must be combined with clinical observations, patient history, and epidemiological information. The expected result is Negative. Fact Sheet for Patients:  https://www.moore.com/https://www.fda.gov/media/142436/download Fact Sheet for Healthcare Providers:  https://www.young.biz/https://www.fda.gov/media/142435/download This test is not yet ap proved or cleared by the [place] FDA and  has been authorized for detection and/or diagnosis of SARS-CoV-2 by FDA under an Emergency Use Authorization (EUA). This EUA will remain  in effect (meaning this test can be used) for the duration of the COVID-19 declaration under Section 564(b)(1) of the Act, 21 U.S.C. section 360bbb-3(b)(1), unless the authorization is terminated or revoked  sooner.    Influenza A by PCR NEGATIVE NEGATIVE Final   Influenza B by PCR NEGATIVE NEGATIVE Final    Comment: (NOTE) The Xpert Xpress SARS-CoV-2/FLU/RSV assay is intended as an aid in  the diagnosis  of influenza from Nasopharyngeal swab specimens and  should not be used as a sole basis for treatment. Nasal washings and  aspirates are unacceptable for Xpert Xpress SARS-CoV-2/FLU/RSV  testing. Fact Sheet for Patients: https://www.moore.com/ Fact Sheet for Healthcare Providers: https://www.young.biz/ This test is not yet approved or cleared by the [place] FDA and  has been authorized for detection and/or diagnosis of SARS-CoV-2 by  FDA under an Emergency Use Authorization (EUA). This EUA will remain  in effect (meaning this test can be used) for the duration of the  Covid-19 declaration under Section 564(b)(1) of the Act, 21  U.S.C. section 360bbb-3(b)(1), unless the authorization is  terminated or revoked. Performed at [facility], [address], [place], [place] [address]      Radiological Exams on Admission: DG Chest 2 View  Result Date: 06/27/2019 CLINICAL DATA:  Atrial fibrillation. Shortness of breath. Sarcoidosis. EXAM: CHEST - 2 VIEW COMPARISON:  Multiple exams, including chest CT from 08/14/2018 in chest radiograph from the same date. FINDINGS: Emphysema. Stable 1.2 cm right upper lobe pulmonary nodule partially obscured by ECG lead. No adenopathy identified. Heart size within normal limits. No pulmonary edema. IMPRESSION: 1. Emphysema. 2. Stable right upper lobe pulmonary nodule. 3. No acute findings. Electronically Signed   By: Gaylyn Rong M.D.   On: 06/27/2019 17:47   CT Angio Chest PE W/Cm &/Or Wo Cm  Result Date: 06/27/2019 CLINICAL DATA:  Shortness of breath and atrial fibrillation. History of sarcoidosis EXAM: CT ANGIOGRAPHY CHEST WITH CONTRAST TECHNIQUE: Multidetector CT imaging of the chest was performed using the  standard protocol during bolus administration of intravenous contrast. Multiplanar CT image reconstructions and MIPs were obtained to evaluate the vascular anatomy. CONTRAST:  OMNIPAQUE IOHEXOL 350 MG/ML SOLN COMPARISON:  Chest radiograph June 27, 2019; chest CT Aug 14, 2018; PET-CT October 05, 2018 FINDINGS: Cardiovascular: There is no demonstrable pulmonary embolus. There is again noted prominence of the ascending thoracic aorta with a measured diameter of 4.9 x 4.8 cm. There are occasional scattered foci of calcification in visualized great vessels. There is aortic atherosclerosis. Slight coronary artery calcification noted. No pericardial effusion or pericardial thickening evident. Mediastinum/Nodes: Visualized thyroid appears normal. There are multiple subcentimeter axillary lymph nodes. There is no evident adenopathy by size criteria in the thoracic region. No esophageal lesions are evident. Lungs/Pleura: There are areas of scarring in the right upper lobe with small bullae. There is a stable nodular opacity in the posterior segment of the right upper lobe measuring 1.4 x 1.3 cm. There are scattered bullae in the lower lobe regions. No new nodular opacities are evident. There is no edema or airspace opacification. No pleural effusions are evident. Upper Abdomen: Foci of upper abdominal aortic atherosclerosis and splenic artery calcification noted. Visualized upper abdominal structures otherwise appear unremarkable. Musculoskeletal: No blastic or lytic bone lesions. No evident chest wall lesions. Review of the MIP images confirms the above findings. IMPRESSION: 1.  No demonstrable pulmonary embolus. 2. Ascending thoracic aortic diameter of 4.9 x 4.8 cm, stable. There are foci of aortic atherosclerosis. No dissection. Slight coronary artery calcification noted. Ascending thoracic aortic aneurysm. Recommend semi-annual imaging followup by CTA or MRA and referral to cardiothoracic surgery if not already  obtained. This recommendation follows 2010 ACCF/AHA/AATS/ACR/ASA/SCA/SCAI/SIR/STS/SVM Guidelines for the Diagnosis and Management of Patients With Thoracic Aortic Disease. Circulation. 2010; 121: B147-W295. Aortic aneurysm NOS (ICD10-I71.9). 3. Stable nodular opacities in the right upper lobe, largest measuring 1.4 x 1.3 cm. Question residua of sarcoidosis. No new  nodular opacities evident. Underlying areas of scarring and bullous disease. No edema or airspace opacity. 4.  No demonstrable thoracic adenopathy. Aortic Atherosclerosis (ICD10-I70.0). Electronically Signed   By: Bretta Bang III M.D.   On: 06/27/2019 19:16    EKG: Independently reviewed. Afib, No ACS  Assessment/Plan Active Problems:   Bicuspid aortic valve   Thoracic aortic aneurysm without rupture (HCC)   Essential hypertension   LADA (latent autoimmune diabetes in adults), managed as type 1 (HCC)   Hypothyroidism   Atrial fibrillation (HCC)     Afib: Unable to get back to sinus as outpt so Cardiology requesting admission for TEE and cardioversion. Pt intermittently taking Eliquis. Current rate controlled. Of note pt also w/ a h/o bicuspid aortic valve and ascending aortic aneurysm (4.9cm). Trop w/o elevation - Cardioversion and TEE per Cards - NPO after midnight.  - Dilt IV PRN. Will let Cards decide on po regimen for DC.  - Continue Eliquis - Tele  DM: Pt reports being very brittle and would prefer to run high due to several hypoglycemic episodes in the past during hospitalizations.  - Sensitive SSI - Resume home regimen and DC  Hypothyroid: - continue Synthroid  HTN: - continue lisinopril - Hydralazine PRN  HLD:  - continue lipitor      DVT prophylaxis: Eliquis  Code Status: Full  Family Communication: none  Disposition Plan: Pending TEE and Cardioversion  Consults called: Cards  Admission status: Obs.     Ozella Rocks MD [facility]  If 7PM-7AM, please contact  night-coverage [email or web address] Password [facility]  06/27/2019, 9:32 PM

## 2019-06-27 NOTE — Progress Notes (Signed)
Patient self check is CBG with is glucometer. CBG 108.

## 2019-06-28 ENCOUNTER — Other Ambulatory Visit: Payer: Self-pay

## 2019-06-28 ENCOUNTER — Encounter (HOSPITAL_COMMUNITY): Payer: Self-pay | Admitting: Family Medicine

## 2019-06-28 DIAGNOSIS — I10 Essential (primary) hypertension: Secondary | ICD-10-CM | POA: Diagnosis not present

## 2019-06-28 DIAGNOSIS — Q231 Congenital insufficiency of aortic valve: Secondary | ICD-10-CM | POA: Diagnosis not present

## 2019-06-28 DIAGNOSIS — E039 Hypothyroidism, unspecified: Secondary | ICD-10-CM

## 2019-06-28 DIAGNOSIS — I480 Paroxysmal atrial fibrillation: Secondary | ICD-10-CM

## 2019-06-28 LAB — HEMOGLOBIN A1C
Hgb A1c MFr Bld: 10.5 % — ABNORMAL HIGH (ref 4.8–5.6)
Mean Plasma Glucose: 254.65 mg/dL

## 2019-06-28 LAB — TSH: TSH: 5.831 u[IU]/mL — ABNORMAL HIGH (ref 0.350–4.500)

## 2019-06-28 LAB — GLUCOSE, CAPILLARY: Glucose-Capillary: 57 mg/dL — ABNORMAL LOW (ref 70–99)

## 2019-06-28 MED ORDER — DILTIAZEM HCL 30 MG PO TABS: 30.0000 mg | ORAL_TABLET | Freq: Four times a day (QID) | ORAL | 3 refills | Status: DC | PRN

## 2019-06-28 MED ORDER — DEXTROSE 50 % IV SOLN
INTRAVENOUS | Status: AC
  Filled 2019-06-28: qty 50

## 2019-06-28 MED ORDER — DEXTROSE 50 % IV SOLN
1.0000 | Freq: Once | INTRAVENOUS | Status: AC
  Administered 2019-06-28: 08:00:00 50 mL via INTRAVENOUS

## 2019-06-28 MED ORDER — APIXABAN 5 MG PO TABS: 5.0000 mg | ORAL_TABLET | Freq: Two times a day (BID) | ORAL | 11 refills | Status: AC

## 2019-06-28 MED ORDER — DILTIAZEM HCL ER COATED BEADS 120 MG PO CP24
120.0000 mg | ORAL_CAPSULE | Freq: Every day | ORAL | Status: DC
  Administered 2019-06-28: 10:00:00 120 mg via ORAL
  Filled 2019-06-28: qty 1

## 2019-06-28 MED ORDER — DILTIAZEM HCL ER COATED BEADS 180 MG PO CP24: 180.0000 mg | ORAL_CAPSULE | Freq: Every day | ORAL | 11 refills | Status: AC

## 2019-06-28 MED ORDER — LEVOTHYROXINE SODIUM 25 MCG PO TABS: 25.0000 ug | ORAL_TABLET | Freq: Every day | ORAL | 6 refills | Status: DC

## 2019-06-28 MED ORDER — DILTIAZEM HCL ER COATED BEADS 180 MG PO CP24: 180.0000 mg | ORAL_CAPSULE | Freq: Every day | ORAL | Status: DC

## 2019-06-28 MED ORDER — DEXTROSE 50 % IV SOLN
1.0000 | Freq: Once | INTRAVENOUS | Status: AC
  Administered 2019-06-28: 50 mL via INTRAVENOUS

## 2019-06-28 MED ORDER — CHANTIX STARTING MONTH PAK 0.5 MG X 11 & 1 MG X 42 PO TABS: ORAL_TABLET | ORAL | 0 refills | Status: DC

## 2019-06-28 NOTE — Discharge Instructions (Signed)
1)You are taking Apixaban/Eliquis for blood thinner so Avoid ibuprofen/Advil/Aleve/Motrin/Goody Powders/Naproxen/BC powders/Meloxicam/Diclofenac/Indomethacin and other Nonsteroidal anti-inflammatory medications as these will make you more likely to bleed and can cause stomach ulcers, can also cause Kidney problems. --- Again please stop ibuprofen  2) please increase Cardizem CD to 180 mg tablets from 120 mg for better heart rate control  3) please stop lisinopril to avoid low blood pressure  4) okay to continue short acting Cardizem 30 mg every 6 hours as needed for tachycardia/fast heart rate  5) smoking cessation strongly advised--may use Chantix to help you quit smoking  6) please follow-up with your cardiologist as outpatient as advised

## 2019-06-28 NOTE — Discharge Summary (Addendum)
Jeffery Beard, is a 59 y.o. male  DOB 09-17-1960  MRN [id number].  Admission date:  06/27/2019  Admitting Physician  Ozella Rocks, MD  Discharge Date:  06/28/2019   Primary MD  Benita Stabile, MD  Recommendations for primary care physician for things to follow:   1)You are taking Apixaban/Eliquis for blood thinner so Avoid ibuprofen/Advil/Aleve/Motrin/Goody Powders/Naproxen/BC powders/Meloxicam/Diclofenac/Indomethacin and other Nonsteroidal anti-inflammatory medications as these will make you more likely to bleed and can cause stomach ulcers, can also cause Kidney problems. --- Again please stop ibuprofen  2) please increase Cardizem CD to 180 mg tablets from 120 mg for better heart rate control  3) please stop lisinopril to avoid low blood pressure  4) okay to continue short acting Cardizem 30 mg every 6 hours as needed for tachycardia/fast heart rate  5) smoking cessation strongly advised--may use Chantix to help you quit smoking  6) please follow-up with your cardiologist as outpatient as advised  Admission Diagnosis  Atrial fibrillation (HCC) [I48.91] Shortness of breath [R06.02] Paroxysmal atrial fibrillation (HCC) [I48.0]   Discharge Diagnosis  Atrial fibrillation (HCC) [I48.91] Shortness of breath [R06.02] Paroxysmal atrial fibrillation (HCC) [I48.0]   Active Problems:   Bicuspid aortic valve   Thoracic aortic aneurysm without rupture (HCC)   Essential hypertension   LADA (latent autoimmune diabetes in adults), managed as type 1 (HCC)   Hypothyroidism   Atrial fibrillation (HCC)      Past Medical History:  Diagnosis Date  . Atrial fibrillation (HCC)    Details unclear - patient states diagnosed in his 76s  . Bicuspid aortic valve   . Hyperlipidemia   . Sarcoidosis   . Type 2 diabetes mellitus (HCC)     Past Surgical History:  Procedure Laterality Date  . LUNG BIOPSY      . Nasal septum surgery    . PALATE / UVULA BIOPSY / EXCISION    . TONSILLECTOMY    . VASECTOMY         HPI  from the history and physical done on the day of admission:    Chief Complaint: Afib  HPI: Jeffery Beard is a 59 y.o. male with medical history significant of Afib w/ intermittent medication use, bicuspid aortic valve, aortic aneurysm, HLD, sarcoidosis, and brittle diabetes.   Pt reports running out of his dilt and Lisinopril 1 wk ago. 4 days ago he began feeling palpitations and mild SOB. Pt knew he was in Afib at that point. Seen by cards 1 day ago who resumed pts medications. HOwever pt has bene unable to get back into sinus and continued to be tachycardic. No CP, diaphoresis, DOE. He reports being told to come to the ED for admission for TEE and cardioversion. Pt reports taking  His Eliquis most of the time.     ED Course: objective findings below. At time of coming to Ed pt had taken 240mg  of Dilt in past 24 hrs so was rate controlled at the time of ED presentation but still in Afib.  Review of Systems: As per HPI otherwise all other systems reviewed and are negative    Hospital Course:     Brief Summary:- 59 y.o. male with a hx of bicuspid aortic valve with mild to mod AS, fusiform ascending aortic aneurysm at 4.9, PAF, flutter on Eliquis, HLD, DM-2 and sarcoidosis  admitted for A. fib with RVR after running out of rate control medications  A/p 1)Paroxysmal Afib with RVR--- in the setting of non-compliance with rate control medications--- cardiology onsult appreciated -Patient is not sure he wants TEE with cardioversion at this time Cardiology service recommends compliance with Eliquis and increase long acting dilt to 180mg  daily, continue 30mg  every 6 hours prn dosing. Hold lisinopril to allow room with bp -Patient has bicuspid aortic valve which is stable also #ascending aortic aneurysm which is stable  2)DM2-A1c is 10.5 reflecting uncontrolled DM in a  patient with compliance issues -The need to be compliant with medications, lifestyle and diet advised follow-up with PCP for further adjustment  3)Hypothyroidism--TSH is 5.3, continue levothyroxine  4)HTN--- stable, okay to stop lisinopril in order to allow room to increase Cardizem as above #1  Discharge Condition: stable  Follow UP  Follow-up Information    Jonelle SidleMcDowell, Samuel G, MD Follow up on 07/14/2019.   Specialty: Cardiology Why: at 1:40 pm  Contact information: [address] [place] [id number] [phone]            Consults obtained - cardiology  Diet and Activity recommendation:  As advised  Discharge Instructions     Discharge Instructions    Call MD for:  difficulty breathing, headache or visual disturbances   Complete by: As directed    Call MD for:  persistant dizziness or light-headedness   Complete by: As directed    Call MD for:  persistant nausea and vomiting   Complete by: As directed    Call MD for:  temperature >100.4   Complete by: As directed    Diet - low sodium heart healthy   Complete by: As directed    Discharge instructions   Complete by: As directed    1)You are taking Apixaban/Eliquis for blood thinner so Avoid ibuprofen/Advil/Aleve/Motrin/Goody Powders/Naproxen/BC powders/Meloxicam/Diclofenac/Indomethacin and other Nonsteroidal anti-inflammatory medications as these will make you more likely to bleed and can cause stomach ulcers, can also cause Kidney problems. --- Again please stop ibuprofen  2) please increase Cardizem CD to 180 mg tablets from 120 mg for better heart rate control  3) please stop lisinopril to avoid low blood pressure  4) okay to continue short acting Cardizem 30 mg every 6 hours as needed for tachycardia/fast heart rate  5) smoking cessation strongly advised--may use Chantix to help you quit smoking  6) please follow-up with your cardiologist as outpatient as advised   Increase activity slowly   Complete  by: As directed         Discharge Medications     Allergies as of 06/28/2019      Reactions   Codeine    Hydrocodone-acetaminophen    Other reaction(s): Other (See Comments) Nervousness and itching      Medication List    STOP taking these medications   ibuprofen 200 MG tablet Commonly known as: ADVIL   lisinopril 10 MG tablet Commonly known as: ZESTRIL     TAKE these medications   acetaminophen 650 MG CR tablet Commonly known as: TYLENOL Take 650 mg by mouth every 8 (eight) hours as needed for pain.   apixaban 5  MG Tabs tablet Commonly known as: ELIQUIS Take 1 tablet (5 mg total) by mouth 2 (two) times daily.   Chantix Starting Month Pak 0.5 MG X 11 & 1 MG X 42 tablet Generic drug: varenicline Take one 0.5 mg tablet by mouth once daily for 3 days, then increase to one 0.5 mg tablet twice daily for 4 days, then increase to one 1 mg tablet twice daily.   diltiazem 180 MG 24 hr capsule Commonly known as: Cardizem CD Take 1 capsule (180 mg total) by mouth daily. What changed:   medication strength  how much to take   diltiazem 30 MG tablet Commonly known as: CARDIZEM Take 1 tablet (30 mg total) by mouth every 6 (six) hours as needed (Tachycardia/fast heart rate). What changed:   when to take this  reasons to take this   FreeStyle Libre 2 Sensor Systm Misc CHANGE SENSOR EVERY 14 DAYS   glucose blood test strip Commonly known as: OneTouch Verio Use as instructed 4 x daily   insulin lispro 200 UNIT/ML KwikPen Commonly known as: HumaLOG KwikPen Inject 8-11 Units into the skin 3 (three) times daily before meals. What changed: how much to take   levothyroxine 25 MCG tablet Commonly known as: SYNTHROID Take 1 tablet (25 mcg total) by mouth daily before breakfast.   mupirocin ointment 2 % Commonly known as: BACTROBAN Apply 1 application topically 3 (three) times daily.   nitroGLYCERIN 0.4 MG SL tablet Commonly known as: NITROSTAT Place 1 tablet (0.4  mg total) under the tongue every 5 (five) minutes as needed for chest pain.   Toujeo SoloStar 300 UNIT/ML Solostar Pen Generic drug: insulin glargine (1 Unit Dial) Inject 66 Units into the skin at bedtime. What changed: how much to take       Major procedures and Radiology Reports - PLEASE review detailed and final reports for all details, in brief -    DG Chest 2 View  Result Date: 06/27/2019 CLINICAL DATA:  Atrial fibrillation. Shortness of breath. Sarcoidosis. EXAM: CHEST - 2 VIEW COMPARISON:  Multiple exams, including chest CT from 08/14/2018 in chest radiograph from the same date. FINDINGS: Emphysema. Stable 1.2 cm right upper lobe pulmonary nodule partially obscured by ECG lead. No adenopathy identified. Heart size within normal limits. No pulmonary edema. IMPRESSION: 1. Emphysema. 2. Stable right upper lobe pulmonary nodule. 3. No acute findings. Electronically Signed   By: Gaylyn Rong M.D.   On: 06/27/2019 17:47   CT Angio Chest PE W/Cm &/Or Wo Cm  Result Date: 06/27/2019 CLINICAL DATA:  Shortness of breath and atrial fibrillation. History of sarcoidosis EXAM: CT ANGIOGRAPHY CHEST WITH CONTRAST TECHNIQUE: Multidetector CT imaging of the chest was performed using the standard protocol during bolus administration of intravenous contrast. Multiplanar CT image reconstructions and MIPs were obtained to evaluate the vascular anatomy. CONTRAST:  OMNIPAQUE IOHEXOL 350 MG/ML SOLN COMPARISON:  Chest radiograph June 27, 2019; chest CT Aug 14, 2018; PET-CT October 05, 2018 FINDINGS: Cardiovascular: There is no demonstrable pulmonary embolus. There is again noted prominence of the ascending thoracic aorta with a measured diameter of 4.9 x 4.8 cm. There are occasional scattered foci of calcification in visualized great vessels. There is aortic atherosclerosis. Slight coronary artery calcification noted. No pericardial effusion or pericardial thickening evident. Mediastinum/Nodes: Visualized  thyroid appears normal. There are multiple subcentimeter axillary lymph nodes. There is no evident adenopathy by size criteria in the thoracic region. No esophageal lesions are evident. Lungs/Pleura: There are areas of scarring in  the right upper lobe with small bullae. There is a stable nodular opacity in the posterior segment of the right upper lobe measuring 1.4 x 1.3 cm. There are scattered bullae in the lower lobe regions. No new nodular opacities are evident. There is no edema or airspace opacification. No pleural effusions are evident. Upper Abdomen: Foci of upper abdominal aortic atherosclerosis and splenic artery calcification noted. Visualized upper abdominal structures otherwise appear unremarkable. Musculoskeletal: No blastic or lytic bone lesions. No evident chest wall lesions. Review of the MIP images confirms the above findings. IMPRESSION: 1.  No demonstrable pulmonary embolus. 2. Ascending thoracic aortic diameter of 4.9 x 4.8 cm, stable. There are foci of aortic atherosclerosis. No dissection. Slight coronary artery calcification noted. Ascending thoracic aortic aneurysm. Recommend semi-annual imaging followup by CTA or MRA and referral to cardiothoracic surgery if not already obtained. This recommendation follows 2010 ACCF/AHA/AATS/ACR/ASA/SCA/SCAI/SIR/STS/SVM Guidelines for the Diagnosis and Management of Patients With Thoracic Aortic Disease. Circulation. 2010; 121: B510-C585. Aortic aneurysm NOS (ICD10-I71.9). 3. Stable nodular opacities in the right upper lobe, largest measuring 1.4 x 1.3 cm. Question residua of sarcoidosis. No new nodular opacities evident. Underlying areas of scarring and bullous disease. No edema or airspace opacity. 4.  No demonstrable thoracic adenopathy. Aortic Atherosclerosis (ICD10-I70.0). Electronically Signed   By: Bretta Bang III M.D.   On: 06/27/2019 19:16    Micro Results    Recent Results (from the past 240 hour(s))  Respiratory Panel by RT PCR (Flu  A&B, Covid) - Nasopharyngeal Swab     Status: None   Collection Time: 06/27/19  7:11 PM   Specimen: Nasopharyngeal Swab  Result Value Ref Range Status   SARS Coronavirus 2 by RT PCR NEGATIVE NEGATIVE Final    Comment: (NOTE) SARS-CoV-2 target nucleic acids are NOT DETECTED. The SARS-CoV-2 RNA is generally detectable in upper respiratoy specimens during the acute phase of infection. The lowest concentration of SARS-CoV-2 viral copies this assay can detect is 131 copies/mL. A negative result does not preclude SARS-Cov-2 infection and should not be used as the sole basis for treatment or other patient management decisions. A negative result may occur with  improper specimen collection/handling, submission of specimen other than nasopharyngeal swab, presence of viral mutation(s) within the areas targeted by this assay, and inadequate number of viral copies (<131 copies/mL). A negative result must be combined with clinical observations, patient history, and epidemiological information. The expected result is Negative. Fact Sheet for Patients:  https://www.moore.com/ Fact Sheet for Healthcare Providers:  https://www.young.biz/ This test is not yet ap proved or cleared by the [place] FDA and  has been authorized for detection and/or diagnosis of SARS-CoV-2 by FDA under an Emergency Use Authorization (EUA). This EUA will remain  in effect (meaning this test can be used) for the duration of the COVID-19 declaration under Section 564(b)(1) of the Act, 21 U.S.C. section 360bbb-3(b)(1), unless the authorization is terminated or revoked sooner.    Influenza A by PCR NEGATIVE NEGATIVE Final   Influenza B by PCR NEGATIVE NEGATIVE Final    Comment: (NOTE) The Xpert Xpress SARS-CoV-2/FLU/RSV assay is intended as an aid in  the diagnosis of influenza from Nasopharyngeal swab specimens and  should not be used as a sole basis for treatment. Nasal washings  and  aspirates are unacceptable for Xpert Xpress SARS-CoV-2/FLU/RSV  testing. Fact Sheet for Patients: https://www.moore.com/ Fact Sheet for Healthcare Providers: https://www.young.biz/ This test is not yet approved or cleared by the [place] and  has been authorized  for detection and/or diagnosis of SARS-CoV-2 by  FDA under an Emergency Use Authorization (EUA). This EUA will remain  in effect (meaning this test can be used) for the duration of the  Covid-19 declaration under Section 564(b)(1) of the Act, 21  U.S.C. section 360bbb-3(b)(1), unless the authorization is  terminated or revoked. Performed at [facility], [address], [place], [place] [address]    Today   Subjective    Christoper Bushey today has no new complaints No fever  Or chills   No Nausea, Vomiting or Diarrhea No chest pains no palpitations no dizziness no shortness of breath   Patient has been seen and examined prior to discharge   Objective   Blood pressure 112/67, pulse 83, temperature 97.7 F (36.5 C), temperature source Oral, resp. rate 20, height 6' (1.829 m), weight 77 kg, SpO2 99 %.   Intake/Output Summary (Last 24 hours) at 06/28/2019 1015 Last data filed at 06/28/2019 4268 Gross per 24 hour  Intake 0 ml  Output --  Net 0 ml   Exam Gen:- Awake Alert, no acute distress  HEENT:- [place].AT, No sclera icterus Neck-Supple Neck,No JVD,.  Lungs-  CTAB , good air movement bilaterally CV- S1, S2 normal, irregularly irregular  abd-  +ve B.Sounds, Abd Soft, No tenderness,    Extremity/Skin:- No  edema,   good pulses Psych-affect is appropriate, oriented x3 Neuro-no new focal deficits, no tremors    Data Review   CBC w Diff:  Lab Results  Component Value Date   WBC 7.6 06/27/2019   HGB 15.0 06/27/2019   HCT 45.3 06/27/2019   PLT 162 06/27/2019   LYMPHOPCT 25 06/27/2019   MONOPCT 7 06/27/2019   EOSPCT 3 06/27/2019   BASOPCT 1 06/27/2019     CMP:  Lab Results  Component Value Date   NA 137 06/27/2019   NA 135 12/08/2018   K 3.6 06/27/2019   CL 105 06/27/2019   CO2 25 06/27/2019   BUN 11 06/27/2019   BUN 12 12/08/2018   CREATININE 0.80 06/27/2019   PROT 6.5 06/27/2019   PROT 6.6 12/08/2018   ALBUMIN 3.7 06/27/2019   ALBUMIN 4.4 12/08/2018   BILITOT 0.5 06/27/2019   BILITOT 0.3 12/08/2018   ALKPHOS 45 06/27/2019   AST 14 (L) 06/27/2019   ALT 14 06/27/2019   Total Discharge time is about 33 minutes  Roxan Hockey M.D on 06/28/2019 at 10:15 AM  Go to [email or web address] -  for contact info  [facility] - Office  [phone]

## 2019-06-28 NOTE — Consult Note (Addendum)
Cardiology Consultation:   Patient ID: Jeffery Beard MRN: [id number]; DOB: September 09, 1960  Admit date: 06/27/2019 Date of Consult: 06/28/2019  Primary Care Provider: Benita Stabile, MD Primary Cardiologist: Nona Dell, MD  Primary Electrophysiologist:  None    Patient Profile:   Jeffery Beard is a 59 y.o. male with a hx of bicuspid aortic valve with mild to mod AS, fusiform ascending aortic aneurysm at 4.9, PAF, flutter on Eliquis, HLD, DM-2 and sarcoidosis who is being seen today for the evaluation of atrial fib at the request of Dr. Mariea Clonts.  History of Present Illness:   Jeffery Beard with above hx that cards follows for bicuspid aortic valve, last echo 08/2018 with mild AS with a calculated valve area of 2.17cm2, EF > 65%, normal size LA. Dr. Donata Clay follows his 4.9 cm fusiform ascending aortic aneurysm and on CT angio yesterday for PE which was neg and stable aneurysm.  He had slight coronary artery calcification noted as well.   Also Rt upper lobe nodule with no metabolic activity by PET imaging.   His DM-2 difficult to control.  Now admitted after running out of meds a week ago and feeling he was in a fib.  meds resumed but still felt a fib.  Presented to ER yesterday pm after seeing PCP and taking 30 of dilt without change in rhythm.   He has no chest pain but does have DOE.     ELIQUIS  Last TSH 12/08/18 was 5.220 will recheck  EKG:  The EKG was personally reviewed and demonstrates:  A fib at 98 with LVH no acute ST changes compared to old EKGS  Telemetry:  Telemetry was personally reviewed and demonstrates:  A fib rate controlled.  Though at times up to 140. For brief period of time.    Na 137, K+ 3.6 Cr 0.80 LFTs WNL Troponin 2 & 3. Hgb 15, WBC 7.6 Plts 162.  2V CXR : IMPRESSION: 1. Emphysema. 2. Stable right upper lobe pulmonary nodule. 3. No acute findings.  CTA as above Today P57-82 BP 104/69 to 97/69   Past Medical History:  Diagnosis Date  . Atrial  fibrillation (HCC)    Details unclear - patient states diagnosed in his 57s  . Bicuspid aortic valve   . Hyperlipidemia   . Sarcoidosis   . Type 2 diabetes mellitus (HCC)     Past Surgical History:  Procedure Laterality Date  . LUNG BIOPSY    . Nasal septum surgery    . PALATE / UVULA BIOPSY / EXCISION    . TONSILLECTOMY    . VASECTOMY       Home Medications:  Prior to Admission medications   Medication Sig Start Date End Date Taking? Authorizing Provider  apixaban (ELIQUIS) 5 MG TABS tablet Take 1 tablet (5 mg total) by mouth 2 (two) times daily. 01/13/19 06/27/19 Yes Jonelle Sidle, MD  Continuous Blood Gluc Sensor (FREESTYLE LIBRE 2 SENSOR SYSTM) MISC CHANGE SENSOR EVERY 14 DAYS 04/13/19  Yes Roma Kayser, MD  diltiazem (CARDIZEM CD) 120 MG 24 hr capsule Take 1 capsule (120 mg total) by mouth daily. 08/16/18 08/16/19 Yes Shah, Pratik D, DO  diltiazem (CARDIZEM) 30 MG tablet Take 30 mg by mouth every 6 (six) hours. 06/26/19  Yes [provider]  glucose blood (ONETOUCH VERIO) test strip Use as instructed 4 x daily 09/08/18  Yes Nida, Denman George, MD  ibuprofen (ADVIL) 200 MG tablet Take 400-600 mg by mouth every 6 (six) hours as  needed.   Yes [provider]  Insulin Glargine, 1 Unit Dial, (TOUJEO SOLOSTAR) 300 UNIT/ML SOPN Inject 66 Units into the skin at bedtime. Patient taking differently: Inject 60 Units into the skin at bedtime.  04/04/19  Yes Nida, Marella Chimes, MD  Insulin Lispro (HUMALOG KWIKPEN) 200 UNIT/ML SOPN Inject 8-11 Units into the skin 3 (three) times daily before meals. Patient taking differently: Inject 8-14 Units into the skin 3 (three) times daily before meals.  09/06/18  Yes Nida, Marella Chimes, MD  lisinopril (ZESTRIL) 10 MG tablet TAKE 1 TABLET(10 MG) BY MOUTH DAILY Patient taking differently: Take 10 mg by mouth daily.  09/30/18  Yes Ivin Poot, MD  nitroGLYCERIN (NITROSTAT) 0.4 MG SL tablet Place 1 tablet (0.4 mg total)  under the tongue every 5 (five) minutes as needed for chest pain. 02/08/19 06/27/19 Yes Satira Sark, MD  acetaminophen (TYLENOL) 650 MG CR tablet Take 650 mg by mouth every 8 (eight) hours as needed for pain.    [provider]  levothyroxine (SYNTHROID) 25 MCG tablet Take 1 tablet (25 mcg total) by mouth daily before breakfast. 09/06/18   Nida, Marella Chimes, MD  mupirocin ointment (BACTROBAN) 2 % Apply 1 application topically 3 (three) times daily. 03/21/19   Jaynee Eagles, PA-C    Inpatient Medications: Scheduled Meds: . apixaban  5 mg Oral BID  . insulin aspart  0-9 Units Subcutaneous TID WC  . levothyroxine  25 mcg Oral QAC breakfast  . lisinopril  10 mg Oral Daily   Continuous Infusions: . sodium chloride 75 mL/hr at 06/28/19 0337   PRN Meds: diltiazem, hydrALAZINE, nitroGLYCERIN, ondansetron **OR** ondansetron (ZOFRAN) IV  Allergies:    Allergies  Allergen Reactions  . Codeine   . Hydrocodone-Acetaminophen     Other reaction(s): Other (See Comments) Nervousness and itching    Social History:   Social History   Socioeconomic History  . Marital status: Married    Spouse name: Not on file  . Number of children: Not on file  . Years of education: Not on file  . Highest education level: Not on file  Occupational History  . Not on file  Tobacco Use  . Smoking status: Current Every Day Smoker    Packs/day: 1.00    Years: 25.00    Pack years: 25.00    Types: Cigarettes  . Smokeless tobacco: Never Used  Substance and Sexual Activity  . Alcohol use: Never  . Drug use: Never  . Sexual activity: Not on file  Other Topics Concern  . Not on file  Social History Narrative  . Not on file   Social Determinants of Health   Financial Resource Strain:   . Difficulty of Paying Living Expenses:   Food Insecurity:   . Worried About Charity fundraiser in the Last Year:   . Arboriculturist in the Last Year:   Transportation Needs:   . Film/video editor  (Medical):   Marland Kitchen Lack of Transportation (Non-Medical):   Physical Activity:   . Days of Exercise per Week:   . Minutes of Exercise per Session:   Stress:   . Feeling of Stress :   Social Connections:   . Frequency of Communication with Friends and Family:   . Frequency of Social Gatherings with Friends and Family:   . Attends Religious Services:   . Active Member of Clubs or Organizations:   . Attends Archivist Meetings:   Marland Kitchen Marital Status:  Intimate Partner Violence:   . Fear of Current or Ex-Partner:   . Emotionally Abused:   Marland Kitchen Physically Abused:   . Sexually Abused:     Family History:    Family History  Problem Relation Age of Onset  . COPD Mother   . High blood pressure Mother   . Cancer - Prostate Father   . High blood pressure Father      ROS:  Please see the history of present illness.  General:no colds or fevers, no weight changes Skin:no rashes or ulcers HEENT:no blurred vision, no congestion CV:see HPI PUL:see HPI GI:no diarrhea constipation or melena, no indigestion-does mention one episode of bright blood in stool probable hemorrhoids.  GU:no hematuria, no dysuria MS:no joint pain, no claudication, + lower back pain and radiation to Lt groin area does have DDD Neuro:no syncope,  + lightheadedness a few times with the a fib Endo:+ diabetes, + thyroid disease  All other ROS reviewed and negative.     Physical Exam/Data:   Vitals:   06/28/19 0027 06/28/19 0543 06/28/19 0727 06/28/19 0810  BP: 90/66 104/69  97/69  Pulse: (!) 54 (!) 57  82  Resp: 20 20    Temp: 97.9 F (36.6 C) 98.3 F (36.8 C)  97.7 F (36.5 C)  TempSrc: Oral Oral  Oral  SpO2: 100% 98% 97% 96%  Weight:      Height:        Intake/Output Summary (Last 24 hours) at 06/28/2019 0820 Last data filed at 06/28/2019 6213 Gross per 24 hour  Intake 0 ml  Output -  Net 0 ml   Last 3 Weights 06/27/2019 06/27/2019 02/08/2019  Weight (lbs) 169 lb 12.1 oz 163 lb 174 lb  Weight (kg)  77 kg 73.936 kg 78.926 kg     Body mass index is 23.02 kg/m.  General:  Well nourished, thin male, in no acute distress with anxiety about procedure  HEENT: normal Lymph: no adenopathy Neck: no JVD Endocrine:  No thryomegaly Vascular: No carotid bruits; pedal pulses 2+ bilaterally   Cardiac:  irreg irreg; 2/6 systolic murmur no gallup or rub Lungs:  clear to auscultation bilaterally, no wheezing, rhonchi or rales  Abd: soft, +tenderness Lt lower abd, no hepatomegaly  Ext: no edema Musculoskeletal:  No deformities, BUE and BLE strength normal and equal Skin: warm and dry  Neuro:  Alert and oriented X 3 MAE follows commands, no focal abnormalities noted Psych:  Normal affect    Relevant CV Studies: Echo 08/15/2018 IMPRESSIONS    1. The left ventricle has hyperdynamic systolic function, with an  ejection fraction of >65%. The cavity size was normal. There is mild  concentric left ventricular hypertrophy. Left ventricular diastolic  function could not be evaluated secondary to  atrial fibrillation. No evidence of left ventricular regional wall motion  abnormalities.  2. The right ventricle has normal systolic function. The cavity was  normal. There is no increase in right ventricular wall thickness.  3. The mitral valve is grossly normal.  4. The tricuspid valve is grossly normal.  5. The aortic valveis bicuspid. Moderate thickening of the aortic valve.  Moderate calcification of the aortic valve. Aortic valve regurgitation is  mild by color flow Doppler. Mild stenosis of the aortic valve.  6. The aortic root is normal in size and structure.  7. There is mild dilatation of the ascending aorta measuring 38 mm.   FINDINGS  Left Ventricle: The left ventricle has hyperdynamic systolic function,  with  an ejection fraction of >65%. The cavity size was normal. There is  mild concentric left ventricular hypertrophy. Left ventricular diastolic  function could not be evaluated   secondary to atrial fibrillation. No evidence of left ventricular regional  wall motion abnormalities..   Right Ventricle: The right ventricle has normal systolic function. The  cavity was normal. There is no increase in right ventricular wall  thickness.   Left Atrium: Left atrial size was normal in size.   Right Atrium: Right atrial size was normal in size. Right atrial pressure  is estimated at 3 mmHg.   Interatrial Septum: No atrial level shunt detected by color flow Doppler.   Pericardium: There is no evidence of pericardial effusion.   Mitral Valve: The mitral valve is grossly normal. Mitral valve  regurgitation is mild by color flow Doppler.   Tricuspid Valve: The tricuspid valve is grossly normal. Tricuspid valve  regurgitation was not visualized by color flow Doppler.   Aortic Valve: The aortic valveis bicuspid Moderate thickening of the  aortic valve. Moderate calcification of the aortic valve. Aortic valve  regurgitation is mild by color flow Doppler. There is Mild stenosis of the  aortic valve, with a calculated valve  area of 2.17 cm.   Pulmonic Valve: The pulmonic valve was grossly normal. Pulmonic valve  regurgitation is not visualized by color flow Doppler.   Aorta: The aortic root is normal in size and structure. There is mild  dilatation of the ascending aorta measuring 38 mm.   Venous: The inferior vena cava is normal in size with greater than 50%  respiratory variability.   Laboratory Data:  High Sensitivity Troponin:   Recent Labs  Lab 06/27/19 1627 06/27/19 1813  TROPONINIHS 2 3     Chemistry Recent Labs  Lab 06/27/19 1627  NA 137  K 3.6  CL 105  CO2 25  GLUCOSE 283*  BUN 11  CREATININE 0.80  CALCIUM 8.8*  GFRNONAA >60  GFRAA >60  ANIONGAP 7    Recent Labs  Lab 06/27/19 1627  PROT 6.5  ALBUMIN 3.7  AST 14*  ALT 14  ALKPHOS 45  BILITOT 0.5   Hematology Recent Labs  Lab 06/27/19 1627  WBC 7.6  RBC 4.92  HGB 15.0  HCT  45.3  MCV 92.1  MCH 30.5  MCHC 33.1  RDW 12.7  PLT 162   BNPNo results for input(s): BNP, PROBNP in the last 168 hours.  DDimer No results for input(s): DDIMER in the last 168 hours.   Radiology/Studies:  DG Chest 2 View  Result Date: 06/27/2019 CLINICAL DATA:  Atrial fibrillation. Shortness of breath. Sarcoidosis. EXAM: CHEST - 2 VIEW COMPARISON:  Multiple exams, including chest CT from 08/14/2018 in chest radiograph from the same date. FINDINGS: Emphysema. Stable 1.2 cm right upper lobe pulmonary nodule partially obscured by ECG lead. No adenopathy identified. Heart size within normal limits. No pulmonary edema. IMPRESSION: 1. Emphysema. 2. Stable right upper lobe pulmonary nodule. 3. No acute findings. Electronically Signed   By: Gaylyn RongWalter  Liebkemann M.D.   On: 06/27/2019 17:47   CT Angio Chest PE W/Cm &/Or Wo Cm  Result Date: 06/27/2019 CLINICAL DATA:  Shortness of breath and atrial fibrillation. History of sarcoidosis EXAM: CT ANGIOGRAPHY CHEST WITH CONTRAST TECHNIQUE: Multidetector CT imaging of the chest was performed using the standard protocol during bolus administration of intravenous contrast. Multiplanar CT image reconstructions and MIPs were obtained to evaluate the vascular anatomy. CONTRAST:  100mL OMNIPAQUE IOHEXOL 350 MG/ML SOLN COMPARISON:  Chest radiograph June 27, 2019; chest CT Aug 14, 2018; PET-CT October 05, 2018 FINDINGS: Cardiovascular: There is no demonstrable pulmonary embolus. There is again noted prominence of the ascending thoracic aorta with a measured diameter of 4.9 x 4.8 cm. There are occasional scattered foci of calcification in visualized great vessels. There is aortic atherosclerosis. Slight coronary artery calcification noted. No pericardial effusion or pericardial thickening evident. Mediastinum/Nodes: Visualized thyroid appears normal. There are multiple subcentimeter axillary lymph nodes. There is no evident adenopathy by size criteria in the thoracic region. No  esophageal lesions are evident. Lungs/Pleura: There are areas of scarring in the right upper lobe with small bullae. There is a stable nodular opacity in the posterior segment of the right upper lobe measuring 1.4 x 1.3 cm. There are scattered bullae in the lower lobe regions. No new nodular opacities are evident. There is no edema or airspace opacification. No pleural effusions are evident. Upper Abdomen: Foci of upper abdominal aortic atherosclerosis and splenic artery calcification noted. Visualized upper abdominal structures otherwise appear unremarkable. Musculoskeletal: No blastic or lytic bone lesions. No evident chest wall lesions. Review of the MIP images confirms the above findings. IMPRESSION: 1.  No demonstrable pulmonary embolus. 2. Ascending thoracic aortic diameter of 4.9 x 4.8 cm, stable. There are foci of aortic atherosclerosis. No dissection. Slight coronary artery calcification noted. Ascending thoracic aortic aneurysm. Recommend semi-annual imaging followup by CTA or MRA and referral to cardiothoracic surgery if not already obtained. This recommendation follows 2010 ACCF/AHA/AATS/ACR/ASA/SCA/SCAI/SIR/STS/SVM Guidelines for the Diagnosis and Management of Patients With Thoracic Aortic Disease. Circulation. 2010; 121: Z610-R604. Aortic aneurysm NOS (ICD10-I71.9). 3. Stable nodular opacities in the right upper lobe, largest measuring 1.4 x 1.3 cm. Question residua of sarcoidosis. No new nodular opacities evident. Underlying areas of scarring and bullous disease. No edema or airspace opacity. 4.  No demonstrable thoracic adenopathy. Aortic Atherosclerosis (ICD10-I70.0). Electronically Signed   By: Bretta Bang III M.D.   On: 06/27/2019 19:16        NO CHEST PAIN Assessment and Plan:   1. PAF recurrence while off meds.  Now rate controlled A fib.  On outpt meds now, neg troponin - Eliquis he does not always take twice a day but beginning 06/24/19 he resumed twice per day.  Plan for TEE DCCV  if pt agreeable.    2. Bicuspid aortic valve stable.  3. Ascending AA stable 4. Hypothyroid will check TSH and continue synthroid. He had not been taking synthroid 5. HTN on lisinopril and BP lower today  6. HLD on statin and per PCP     Nada Boozer, NP  06/28/2019 9:18 AM   For questions or updates, please contact [facility] Please consult [email or web address] for contact info under     Signed, Nada Boozer, NP  06/28/2019 8:20 AM   Patient seen and discussed with NP Annie Paras, I agree with her documentation. 59 yo male history of ascending thoracic aneurysm being monitored by CT surgery, bicuspid AV with mild to mod AS, PAF, DM2, sarcoid, presents with fatigue and palpitations. Ran out of his diltiazem 1 week ago, poor compliance with home eliquis and thus was not cardioverted in the ER.  He reports heart fluttering, dizziness, SOB. Not much improved with restarting his dilt 120 long acting, his pcp started him on short acting dilt  ever 6 hours, noted significant improvement when taking this 4 times daily along with his long acting diltliazem.    K 3.6 Cr 0.8 WBC 7.6 Hgb 15 Plt  162  hstrop 2 -->3 EKG afib COVID neg CXR no acute process CT PE no PE, stable ascending thoracic aneurysm  08/2018 echo LVEF >65%, bicuspid AV, mild AS  Tele shows afib rates 80s to low 100s, occasional spikes to 120.   Patient presents with symptomatic afib, mild tachcyardia that has resolved. Initial plan on admission  was TEE/DCCV. In talking with patient he is very hesitant for procedure. We discussed in detail our options are to either do procedure today and get him back in SR, or if he is very reluctant we could adjust his diltiazem and wait 3 weeks on eliquis and consider a DCCV alone at that time if he does not self convert. He reports significantly improved symptoms on dilt long acting 120 and taking short acting dilt 30mg  every 6 hours. He asks me if its absolutely neccesary to do the procedure  today, to which the answer is no since rates controlled and symptoms improved.  After lengthy discussion he favors the more conservative strategy of increased rate control and monitoring. I think this is reasonable given he is rate controlled and symptoms improved. Will arrange an outpatient f/u in 2.[redacted] weeks along with EKG, if still in afib will need to consider DCCV at that time if compliant with eliquis.   Increase long acting dilt to 180mg  daily, continue 30mg  every 6 hours prn dosing. Hold lisinopril to allow room with bp. Please provide a work note at discharge clearing him until Monday.   Ok for discharge from cardiac standpoint, we will sign off inpatient care.     MD

## 2019-06-28 NOTE — Progress Notes (Signed)
Patient CBG is 166 as of now.

## 2019-06-28 NOTE — Progress Notes (Signed)
Patient reported CBG 66, patient used his own glucometer. MD notified, ordered 50% dextrose 1 amp.

## 2019-07-03 ENCOUNTER — Telehealth: Payer: Self-pay | Admitting: Cardiology

## 2019-07-03 NOTE — Telephone Encounter (Signed)
Patient called stating that he was admitted to [facility] 06/27/2019 for A.Fib. He was wanting to let Dr. Diona Browner know this.

## 2019-07-03 NOTE — Telephone Encounter (Signed)
Will forward to Dr. McDowell as an FYI.  

## 2019-07-04 NOTE — Telephone Encounter (Signed)
Yes, please let him know that I was aware.  In fact, I spoke with the ER provider that saw him on the night he was admitted and also communicated with Dr. Wyline Mood who saw him in consultation.  Since he elected not to undergo a TEE cardioversion at that time, we will need to wait 3 weeks with him taking Eliquis regularly before we can consider cardioversion.  He might spontaneously convert back to sinus rhythm in the meanwhile on the other hand.  Make sure that he has an office visit scheduled in the next 3 weeks or so to review possibility of cardioversion.

## 2019-07-06 NOTE — Telephone Encounter (Signed)
Pt aware, follow up scheduled.

## 2019-07-14 ENCOUNTER — Ambulatory Visit: Payer: Managed Care, Other (non HMO) | Admitting: Cardiology

## 2019-07-14 ENCOUNTER — Other Ambulatory Visit: Payer: Self-pay

## 2019-07-14 ENCOUNTER — Encounter: Payer: Self-pay | Admitting: Cardiology

## 2019-07-14 VITALS — BP 118/78 | HR 62 | Temp 97.1°F | Ht 72.0 in | Wt 172.0 lb

## 2019-07-14 DIAGNOSIS — Q231 Congenital insufficiency of aortic valve: Secondary | ICD-10-CM

## 2019-07-14 DIAGNOSIS — I712 Thoracic aortic aneurysm, without rupture: Secondary | ICD-10-CM | POA: Diagnosis not present

## 2019-07-14 DIAGNOSIS — I350 Nonrheumatic aortic (valve) stenosis: Secondary | ICD-10-CM

## 2019-07-14 DIAGNOSIS — I4819 Other persistent atrial fibrillation: Secondary | ICD-10-CM

## 2019-07-14 DIAGNOSIS — Q2381 Bicuspid aortic valve: Secondary | ICD-10-CM

## 2019-07-14 DIAGNOSIS — I7121 Aneurysm of the ascending aorta, without rupture: Secondary | ICD-10-CM

## 2019-07-14 NOTE — Patient Instructions (Signed)
Medication Instructions:  Your physician recommends that you continue on your current medications as directed. Please refer to the Current Medication list given to you today.   Labwork: none  Testing/Procedures: Your physician has requested that you have an echocardiogram. Echocardiography is a painless test that uses sound waves to create images of your heart. It provides your doctor with information about the size and shape of your heart and how well your heart's chambers and valves are working. This procedure takes approximately one hour. There are no restrictions for this procedure.    Follow-Up: Your physician wants you to follow-up in: 6 months.  You will receive a reminder letter in the mail two months in advance. If you don't receive a letter, please call our office to schedule the follow-up appointment.   Any Other Special Instructions Will Be Listed Below (If Applicable).   Please call to schedule an appointment with Dr. Donata Clay [phone]  If you need a refill on your cardiac medications before your next appointment, please call your pharmacy.

## 2019-07-14 NOTE — Progress Notes (Signed)
Cardiology Office Note  Date: 07/14/2019   ID: Jeffery Beard, DOB 01/06/1961, MRN [id number]  PCP:  Benita Stabile, MD  Cardiologist:  Nona Dell, MD Electrophysiologist:  None   Chief Complaint  Patient presents with  . Hospitalization Follow-up    History of Present Illness: Jeffery Beard is a 59 y.o. male last seen in November 2020.  He presents for post hospital follow-up.  He had recently with recurrent symptomatic atrial fibrillation, had not been compliant with Eliquis and therefore was not cardioverted in the ER.  He was seen by Dr. Wyline Mood in consultation with discussion regarding TEE guided cardioversion, and decision was made to continue rate control strategy with anticoagulation and arrange outpatient follow-up.  He presents today for reassessment.  He has a cardiac telemetry APP on his phone, we reviewed several strips that show that he converted spontaneously back to sinus rhythm within a few days after discharge from the hospital.  He states that he feels better.  I reviewed his medications, reinforced compliance with Eliquis and plan to continue Cardizem CD 180 mg daily.  Blood pressure and heart rate look good today.  He is following with Dr. Donata Clay for known ascending thoracic aortic aneurysm.  He was last seen in July 2020.  He was to have a follow-up chest CTA around December of last year, however this was delayed until just recently.  Chest CTA on March 23 showed stable 4.9 x 4.8 cm ascending thoracic aortic aneurysm without dissection.  His last echocardiogram was in May 2020 with definition of bicuspid aortic valve and at that point mild aortic stenosis.  Past Medical History:  Diagnosis Date  . Atrial fibrillation (HCC)    Details unclear - patient states diagnosed in his 53s  . Bicuspid aortic valve   . Hyperlipidemia   . Sarcoidosis   . Thoracic ascending aortic aneurysm (HCC)   . Type 2 diabetes mellitus (HCC)     Past Surgical History:    Procedure Laterality Date  . LUNG BIOPSY    . Nasal septum surgery    . PALATE / UVULA BIOPSY / EXCISION    . TONSILLECTOMY    . VASECTOMY      Current Outpatient Medications  Medication Sig Dispense Refill  . acetaminophen (TYLENOL) 650 MG CR tablet Take 650 mg by mouth every 8 (eight) hours as needed for pain.    Marland Kitchen apixaban (ELIQUIS) 5 MG TABS tablet Take 1 tablet (5 mg total) by mouth 2 (two) times daily. 60 tablet 11  . Continuous Blood Gluc Sensor (FREESTYLE LIBRE 2 SENSOR SYSTM) MISC CHANGE SENSOR EVERY 14 DAYS 6 each 2  . diltiazem (CARDIZEM CD) 180 MG 24 hr capsule Take 1 capsule (180 mg total) by mouth daily. 30 capsule 11  . diltiazem (CARDIZEM) 30 MG tablet Take 1 tablet (30 mg total) by mouth every 6 (six) hours as needed (Tachycardia/fast heart rate). 30 tablet 3  . glucose blood (ONETOUCH VERIO) test strip Use as instructed 4 x daily 150 each 2  . Insulin Glargine, 1 Unit Dial, (TOUJEO SOLOSTAR) 300 UNIT/ML SOPN Inject 66 Units into the skin at bedtime. (Patient taking differently: Inject 60 Units into the skin at bedtime. ) 27 mL 1  . Insulin Lispro (HUMALOG KWIKPEN) 200 UNIT/ML SOPN Inject 8-11 Units into the skin 3 (three) times daily before meals. (Patient taking differently: Inject 8-14 Units into the skin 3 (three) times daily before meals. ) 5 pen 5  . levothyroxine (SYNTHROID)  25 MCG tablet Take 1 tablet (25 mcg total) by mouth daily before breakfast. 30 tablet 6  . mupirocin ointment (BACTROBAN) 2 % Apply 1 application topically 3 (three) times daily. 30 g 0  . nitroGLYCERIN (NITROSTAT) 0.4 MG SL tablet Place 1 tablet (0.4 mg total) under the tongue every 5 (five) minutes as needed for chest pain. 25 tablet 3  . varenicline (CHANTIX STARTING MONTH PAK) 0.5 MG X 11 & 1 MG X 42 tablet Take one 0.5 mg tablet by mouth once daily for 3 days, then increase to one 0.5 mg tablet twice daily for 4 days, then increase to one 1 mg tablet twice daily. 53 tablet 0   No current  facility-administered medications for this visit.   Allergies:  Codeine and Hydrocodone-acetaminophen   ROS:   No syncope.  Physical Exam: VS:  BP 118/78   Pulse 62   Temp (!) 97.1 F (36.2 C)   Ht 6' (1.829 m)   Wt 172 lb (78 kg)   SpO2 98%   BMI 23.33 kg/m , BMI Body mass index is 23.33 kg/m.  Wt Readings from Last 3 Encounters:  07/14/19 172 lb (78 kg)  06/27/19 169 lb 12.1 oz (77 kg)  02/08/19 174 lb (78.9 kg)    General: Patient appears comfortable at rest. HEENT: Conjunctiva and lids normal, wearing a mask. Neck: Supple, no elevated JVP or carotid bruits, no thyromegaly. Lungs: Clear to auscultation, nonlabored breathing at rest. Cardiac: Regular rate and rhythm, no S3, 2/6 systolic murmur, no pericardial rub. Abdomen: Soft, bowel sounds present. Extremities: No pitting edema, distal pulses 2+.  ECG:  An ECG dated 06/27/2019 was personally reviewed today and demonstrated:  Rate controlled atrial fibrillation with increased voltage and nonspecific T wave changes.  Recent Labwork: 08/16/2018: Magnesium 1.8 06/27/2019: ALT 14; AST 14; BUN 11; Creatinine, Ser 0.80; Hemoglobin 15.0; Platelets 162; Potassium 3.6; Sodium 137; TSH 5.831     Component Value Date/Time   CHOL 141 01/22/2018 0000   TRIG 89 01/22/2018 0000   HDL 49 01/22/2018 0000   LDLCALC 79 01/22/2018 0000    Other Studies Reviewed Today:  Echocardiogram 08/15/2018: 1. The left ventricle has hyperdynamic systolic function, with an  ejection fraction of >65%. The cavity size was normal. There is mild  concentric left ventricular hypertrophy. Left ventricular diastolic  function could not be evaluated secondary to  atrial fibrillation. No evidence of left ventricular regional wall motion  abnormalities.  2. The right ventricle has normal systolic function. The cavity was  normal. There is no increase in right ventricular wall thickness.  3. The mitral valve is grossly normal.  4. The tricuspid valve  is grossly normal.  5. The aortic valveis bicuspid. Moderate thickening of the aortic valve.  Moderate calcification of the aortic valve. Aortic valve regurgitation is  mild by color flow Doppler. Mild stenosis of the aortic valve.  6. The aortic root is normal in size and structure.  7. There is mild dilatation of the ascending aorta measuring 38 mm.   Assessment and Plan:  1.  Paroxysmal to persistent atrial fibrillation/flutter.  CHA2DS2-VASc or is 2.  We discussed continuing Eliquis and also Cardizem CD 180 mg daily for now.  I did talk with him about the progressive nature of his atrial arrhythmia, possibility that he may need to be considered for antiarrhythmic therapy and EP consultation depending on rhythm control over time.  2.  Bicuspid aortic valve with mild aortic stenosis by echocardiogram in  May 2020.  We will obtain a follow-up study for his next visit in 6 months.  3.  Ascending thoracic aortic aneurysm, 4.9 x 4.8 cm and stable by recent follow-up chest CTA.  He is following with Dr. Donata Clay, we will arrange a follow-up visit with him around the time that we get the next echocardiogram.  Medication Adjustments/Labs and Tests Ordered: Current medicines are reviewed at length with the patient today.  Concerns regarding medicines are outlined above.   Tests Ordered: Orders Placed This Encounter  Procedures  . ECHOCARDIOGRAM COMPLETE    Medication Changes: No orders of the defined types were placed in this encounter.   Disposition:  Follow up 6 months in the [place] office.  Signed, Jonelle Sidle, MD, [facility] 07/14/2019 4:52 PM    [facility] [address], [place], [place] [address] Phone: [phone]; Fax: [phone]

## 2019-09-01 ENCOUNTER — Ambulatory Visit
Admission: EM | Admit: 2019-09-01 | Discharge: 2019-09-01 | Disposition: A | Discharge status: Home / Self Care | Payer: Managed Care, Other (non HMO) | Attending: Emergency Medicine | Admitting: Emergency Medicine

## 2019-09-01 DIAGNOSIS — J441 Chronic obstructive pulmonary disease with (acute) exacerbation: Secondary | ICD-10-CM

## 2019-09-01 DIAGNOSIS — Z20822 Contact with and (suspected) exposure to covid-19: Secondary | ICD-10-CM

## 2019-09-01 DIAGNOSIS — R05 Cough: Secondary | ICD-10-CM

## 2019-09-01 DIAGNOSIS — R059 Cough, unspecified: Secondary | ICD-10-CM

## 2019-09-01 MED ORDER — ALBUTEROL SULFATE HFA 108 (90 BASE) MCG/ACT IN AERS: 1.0000 | INHALATION_SPRAY | Freq: Four times a day (QID) | RESPIRATORY_TRACT | 0 refills | Status: DC | PRN

## 2019-09-01 MED ORDER — BENZONATATE 100 MG PO CAPS: 100.0000 mg | ORAL_CAPSULE | Freq: Three times a day (TID) | ORAL | 0 refills | Status: DC

## 2019-09-01 MED ORDER — PREDNISONE 20 MG PO TABS: 20.0000 mg | ORAL_TABLET | Freq: Two times a day (BID) | ORAL | 0 refills | Status: AC

## 2019-09-01 MED ORDER — AZITHROMYCIN 250 MG PO TABS: 250.0000 mg | ORAL_TABLET | Freq: Every day | ORAL | 0 refills | Status: DC

## 2019-09-01 MED ORDER — METHYLPREDNISOLONE SODIUM SUCC 125 MG IJ SOLR
80.0000 mg | Freq: Once | INTRAMUSCULAR | Status: AC
  Administered 2019-09-01: 80 mg via INTRAMUSCULAR

## 2019-09-01 NOTE — Discharge Instructions (Signed)
Steroid shot given in office COVID testing ordered.  It will take between 2-5 days for test results.  Someone will contact you regarding abnormal results.   In the meantime: You should remain isolated in your home for 10 days from symptom onset AND greater than 72 hours after symptoms resolution (absence of fever without the use of fever-reducing medication and improvement in respiratory symptoms), whichever is longer Get plenty of rest and push fluids Steroid and azithromycin prescribed for possible COPD exacerbation Tessalon Perles prescribed for cough Albuterol inhaler prescribed.  Use as needed for shortness of breath and/or wheezing Use OTC zyrtec for nasal congestion, runny nose, and/or sore throat Use OTC flonase for nasal congestion and runny nose Use OTC medications like ibuprofen or tylenol as needed fever or pain Call or go to the ED if you have any new or worsening symptoms such as fever, worsening cough, shortness of breath, chest tightness, chest pain, turning blue, changes in mental status, etc..Marland Kitchen

## 2019-09-01 NOTE — ED Provider Notes (Signed)
[facility]   [id number] 09/01/19 Arrival Time: 1100   CC: COVID symptoms  SUBJECTIVE: History from: patient.  Jeffery Beard is a 59 y.o. male hx significant for persistent a fib, HLD, TAAA, and type 2 DM, who presents with fatigue, productive cough with white/ brown sputum, slight runny nose, sore throat, and nausea x 1 week, worsening symptoms over the past 1-2 days.  Denies sick exposure to COVID, flu or strep.  However, is a carrier for [facility], and transfers mutliple COVID tests from different locations.  Also admits to smoking 1.5 PPD x 20 years.   Has tried OTC medications without relief.  Symptoms are made worse with deep breaths.  Reports previous symptoms in the past.   Denies fever, chills, SOB, wheezing, chest pain, changes in bowel or bladder habits.    ROS: As per HPI.  All other pertinent ROS negative.     Past Medical History:  Diagnosis Date  . Atrial fibrillation (HCC)    Details unclear - patient states diagnosed in his 70s  . Bicuspid aortic valve   . Hyperlipidemia   . Sarcoidosis   . Thoracic ascending aortic aneurysm (HCC)   . Type 2 diabetes mellitus (HCC)    Past Surgical History:  Procedure Laterality Date  . LUNG BIOPSY    . Nasal septum surgery    . PALATE / UVULA BIOPSY / EXCISION    . TONSILLECTOMY    . VASECTOMY     Allergies  Allergen Reactions  . Codeine   . Hydrocodone-Acetaminophen     Other reaction(s): Other (See Comments) Nervousness and itching   No current facility-administered medications on file prior to encounter.   Current Outpatient Medications on File Prior to Encounter  Medication Sig Dispense Refill  . acetaminophen (TYLENOL) 650 MG CR tablet Take 650 mg by mouth every 8 (eight) hours as needed for pain.    Marland Kitchen apixaban (ELIQUIS) 5 MG TABS tablet Take 1 tablet (5 mg total) by mouth 2 (two) times daily. 60 tablet 11  . Continuous Blood Gluc Sensor (FREESTYLE LIBRE 2 SENSOR SYSTM) MISC CHANGE SENSOR EVERY 14 DAYS  6 each 2  . diltiazem (CARDIZEM CD) 180 MG 24 hr capsule Take 1 capsule (180 mg total) by mouth daily. 30 capsule 11  . diltiazem (CARDIZEM) 30 MG tablet Take 1 tablet (30 mg total) by mouth every 6 (six) hours as needed (Tachycardia/fast heart rate). 30 tablet 3  . glucose blood (ONETOUCH VERIO) test strip Use as instructed 4 x daily 150 each 2  . Insulin Glargine, 1 Unit Dial, (TOUJEO SOLOSTAR) 300 UNIT/ML SOPN Inject 66 Units into the skin at bedtime. (Patient taking differently: Inject 60 Units into the skin at bedtime. ) 27 mL 1  . Insulin Lispro (HUMALOG KWIKPEN) 200 UNIT/ML SOPN Inject 8-11 Units into the skin 3 (three) times daily before meals. (Patient taking differently: Inject 8-14 Units into the skin 3 (three) times daily before meals. ) 5 pen 5  . levothyroxine (SYNTHROID) 25 MCG tablet Take 1 tablet (25 mcg total) by mouth daily before breakfast. 30 tablet 6  . mupirocin ointment (BACTROBAN) 2 % Apply 1 application topically 3 (three) times daily. 30 g 0  . nitroGLYCERIN (NITROSTAT) 0.4 MG SL tablet Place 1 tablet (0.4 mg total) under the tongue every 5 (five) minutes as needed for chest pain. 25 tablet 3  . varenicline (CHANTIX STARTING MONTH PAK) 0.5 MG X 11 & 1 MG X 42 tablet Take one 0.5 mg tablet  by mouth once daily for 3 days, then increase to one 0.5 mg tablet twice daily for 4 days, then increase to one 1 mg tablet twice daily. 53 tablet 0   Social History   Socioeconomic History  . Marital status: Married    Spouse name: Not on file  . Number of children: Not on file  . Years of education: Not on file  . Highest education level: Not on file  Occupational History  . Not on file  Tobacco Use  . Smoking status: Current Every Day Smoker    Packs/day: 1.00    Years: 25.00    Pack years: 25.00    Types: Cigarettes  . Smokeless tobacco: Never Used  Substance and Sexual Activity  . Alcohol use: Never  . Drug use: Never  . Sexual activity: Not on file  Other Topics  Concern  . Not on file  Social History Narrative  . Not on file   Social Determinants of Health   Financial Resource Strain:   . Difficulty of Paying Living Expenses:   Food Insecurity:   . Worried About Charity fundraiser in the Last Year:   . Arboriculturist in the Last Year:   Transportation Needs:   . Film/video editor (Medical):   Marland Kitchen Lack of Transportation (Non-Medical):   Physical Activity:   . Days of Exercise per Week:   . Minutes of Exercise per Session:   Stress:   . Feeling of Stress :   Social Connections:   . Frequency of Communication with Friends and Family:   . Frequency of Social Gatherings with Friends and Family:   . Attends Religious Services:   . Active Member of Clubs or Organizations:   . Attends Archivist Meetings:   Marland Kitchen Marital Status:   Intimate Partner Violence:   . Fear of Current or Ex-Partner:   . Emotionally Abused:   Marland Kitchen Physically Abused:   . Sexually Abused:    Family History  Problem Relation Age of Onset  . COPD Mother   . High blood pressure Mother   . Cancer - Prostate Father   . High blood pressure Father     OBJECTIVE:  Vitals:   09/01/19 1111  BP: (!) 143/93  Pulse: 87  Resp: (!) 22  Temp: 98.7 F (37.1 C)  SpO2: 96%     General appearance: alert; appears fatigued, but nontoxic; speaking in full sentences and tolerating own secretions HEENT: NCAT; Ears: EACs clear, TMs pearly gray; Eyes: PERRL.  EOM grossly intact. Nose: nares patent without rhinorrhea, Throat: oropharynx clear, tonsils non erythematous or enlarged, uvula absent Neck: supple without LAD Lungs: unlabored respirations, symmetrical air entry; cough: moderate to persistent; no respiratory distress; CTAB Heart: Murmur present; regularly irregular Skin: warm and dry Psychological: alert and cooperative; normal mood and affect  ASSESSMENT & PLAN:  1. Cough   2. Suspected COVID-19 virus infection   3. COPD exacerbation ([place])     Meds  ordered this encounter  Medications  . predniSONE (DELTASONE) 20 MG tablet    Sig: Take 1 tablet (20 mg total) by mouth 2 (two) times daily with a meal for 5 days.    Dispense:  10 tablet    Refill:  0    Order Specific Question:   Supervising Provider    Answer:   Raylene Everts [[id number]]  . benzonatate (TESSALON) 100 MG capsule    Sig: Take 1 capsule (100 mg total) by  mouth every 8 (eight) hours.    Dispense:  21 capsule    Refill:  0    Order Specific Question:   Supervising Provider    Answer:   Eustace Moore [[id number]]  . azithromycin (ZITHROMAX) 250 MG tablet    Sig: Take 1 tablet (250 mg total) by mouth daily. Take first 2 tablets together, then 1 every day until finished.    Dispense:  6 tablet    Refill:  0    Order Specific Question:   Supervising Provider    Answer:   Eustace Moore [[id number]]  . albuterol (VENTOLIN HFA) 108 (90 Base) MCG/ACT inhaler    Sig: Inhale 1-2 puffs into the lungs every 6 (six) hours as needed for wheezing or shortness of breath.    Dispense:  18 g    Refill:  0    Order Specific Question:   Supervising Provider    Answer:   Eustace Moore [[id number]]  . methylPREDNISolone sodium succinate (SOLU-MEDROL) 125 mg/2 mL injection 80 mg   Steroid shot given in office COVID testing ordered.  It will take between 2-5 days for test results.  Someone will contact you regarding abnormal results.   In the meantime: You should remain isolated in your home for 10 days from symptom onset AND greater than 72 hours after symptoms resolution (absence of fever without the use of fever-reducing medication and improvement in respiratory symptoms), whichever is longer Get plenty of rest and push fluids Steroid and azithromycin prescribed for possible COPD exacerbation given symptoms and hx of tobacco use Tessalon Perles prescribed for cough Albuterol inhaler prescribed.  Use as needed for shortness of breath and/or wheezing Use OTC zyrtec for nasal  congestion, runny nose, and/or sore throat Use OTC flonase for nasal congestion and runny nose Use OTC medications like ibuprofen or tylenol as needed fever or pain Call or go to the ED if you have any new or worsening symptoms such as fever, worsening cough, shortness of breath, chest tightness, chest pain, turning blue, changes in mental status, etc...   Reviewed expectations re: course of current medical issues. Questions answered. Outlined signs and symptoms indicating need for more acute intervention. Patient verbalized understanding. After Visit Summary given.         Rennis Harding, PA-C 09/01/19 1135

## 2019-09-01 NOTE — ED Triage Notes (Signed)
Pt presents with c/o productive cough for past couple days

## 2019-09-02 LAB — NOVEL CORONAVIRUS, NAA: SARS-CoV-2, NAA: NOT DETECTED

## 2019-09-02 LAB — SARS-COV-2, NAA 2 DAY TAT

## 2019-10-31 ENCOUNTER — Encounter: Payer: Self-pay | Admitting: Emergency Medicine

## 2019-10-31 ENCOUNTER — Other Ambulatory Visit: Payer: Self-pay

## 2019-10-31 ENCOUNTER — Ambulatory Visit (INDEPENDENT_AMBULATORY_CARE_PROVIDER_SITE_OTHER): Payer: Managed Care, Other (non HMO)

## 2019-10-31 ENCOUNTER — Ambulatory Visit
Admission: EM | Admit: 2019-10-31 | Discharge: 2019-10-31 | Disposition: A | Discharge status: Home / Self Care | Payer: Managed Care, Other (non HMO) | Attending: Family Medicine | Admitting: Family Medicine

## 2019-10-31 DIAGNOSIS — R238 Other skin changes: Secondary | ICD-10-CM

## 2019-10-31 DIAGNOSIS — M79671 Pain in right foot: Secondary | ICD-10-CM

## 2019-10-31 DIAGNOSIS — S92421A Displaced fracture of distal phalanx of right great toe, initial encounter for closed fracture: Secondary | ICD-10-CM

## 2019-10-31 MED ORDER — CEPHALEXIN 500 MG PO CAPS
500.0000 mg | ORAL_CAPSULE | Freq: Four times a day (QID) | ORAL | 0 refills | Status: DC
Start: 2019-10-31 — End: 2020-03-13

## 2019-10-31 NOTE — ED Triage Notes (Signed)
Pt dropped a 5lb weight on his great toe last night, pain bruisng. Pt is diabetic.

## 2019-10-31 NOTE — Discharge Instructions (Addendum)
You have a broken toe.  Giving you a shoe to wear  You need to stay off as much as possible Rest, ice and keep clean Take antibiotics as prescribed.

## 2019-11-01 ENCOUNTER — Telehealth: Payer: Self-pay | Admitting: Orthopedic Surgery

## 2019-11-01 NOTE — ED Provider Notes (Signed)
[facility]    CSN: [id number] Arrival date & time: 10/31/19  1857      History   Chief Complaint No chief complaint on file.   HPI Jeffery Beard is a 59 y.o. male.   Patient is a 59 year old male presents today with right great toe injury.  Reporting dropped a 5 pound weight on his great toe last night.  Patient is a diabetic and concerned.  Toe is very painful and swollen.  Open wound just under nailbed.  Bleeding controlled.  Patient is also on blood thinners.  Patient with past medical history of A. fib, hyperlipidemia, sarcoidosis, thoracic ascending aortic aneurysm, type 2 diabetes.  ROS per HPI      Past Medical History:  Diagnosis Date  . Atrial fibrillation (HCC)    Details unclear - patient states diagnosed in his 80s  . Bicuspid aortic valve   . Hyperlipidemia   . Sarcoidosis   . Thoracic ascending aortic aneurysm (HCC)   . Type 2 diabetes mellitus [facility])     Patient Active Problem List   Diagnosis Date Noted  . Atrial fibrillation (HCC) 06/27/2019  . LADA (latent autoimmune diabetes in adults), managed as type 1 (HCC) 09/06/2018  . Hypothyroidism 09/06/2018  . Bicuspid aortic valve   . Nonrheumatic aortic valve stenosis   . Thoracic aortic aneurysm without rupture (HCC)   . Essential hypertension   . Sarcoidosis   . Atrial fibrillation with RVR (HCC) 08/14/2018  . Mixed hyperlipidemia 03/24/2018  . Current smoker 03/24/2018    Past Surgical History:  Procedure Laterality Date  . LUNG BIOPSY    . Nasal septum surgery    . PALATE / UVULA BIOPSY / EXCISION    . TONSILLECTOMY    . VASECTOMY         Home Medications    Prior to Admission medications   Medication Sig Start Date End Date Taking? Authorizing Provider  acetaminophen (TYLENOL) 650 MG CR tablet Take 650 mg by mouth every 8 (eight) hours as needed for pain.    [provider]  albuterol (VENTOLIN HFA) 108 (90 Base) MCG/ACT inhaler Inhale 1-2 puffs into the  lungs every 6 (six) hours as needed for wheezing or shortness of breath. 09/01/19   Wurst, [place], PA-C  apixaban (ELIQUIS) 5 MG TABS tablet Take 1 tablet (5 mg total) by mouth 2 (two) times daily. 06/28/19 07/28/19  Shon Hale, MD  azithromycin (ZITHROMAX) 250 MG tablet Take 1 tablet (250 mg total) by mouth daily. Take first 2 tablets together, then 1 every day until finished. 09/01/19   Wurst, [place], PA-C  benzonatate (TESSALON) 100 MG capsule Take 1 capsule (100 mg total) by mouth every 8 (eight) hours. 09/01/19   Wurst, [place], PA-C  cephALEXin (KEFLEX) 500 MG capsule Take 1 capsule (500 mg total) by mouth 4 (four) times daily. 10/31/19   Janace Aris, NP  Continuous Blood Gluc Sensor (FREESTYLE LIBRE 2 SENSOR SYSTM) MISC CHANGE SENSOR EVERY 14 DAYS 04/13/19   Roma Kayser, MD  diltiazem (CARDIZEM CD) 180 MG 24 hr capsule Take 1 capsule (180 mg total) by mouth daily. 06/28/19 06/27/20  Shon Hale, MD  diltiazem (CARDIZEM) 30 MG tablet Take 1 tablet (30 mg total) by mouth every 6 (six) hours as needed (Tachycardia/fast heart rate). 06/28/19   Shon Hale, MD  glucose blood (ONETOUCH VERIO) test strip Use as instructed 4 x daily 09/08/18   Nida, Denman George, MD  Insulin Glargine, 1 Unit Dial, (TOUJEO  SOLOSTAR) 300 UNIT/ML SOPN Inject 66 Units into the skin at bedtime. Patient taking differently: Inject 60 Units into the skin at bedtime.  04/04/19   Roma Kayser, MD  Insulin Lispro (HUMALOG KWIKPEN) 200 UNIT/ML SOPN Inject 8-11 Units into the skin 3 (three) times daily before meals. Patient taking differently: Inject 8-14 Units into the skin 3 (three) times daily before meals.  09/06/18   Roma Kayser, MD  levothyroxine (SYNTHROID) 25 MCG tablet Take 1 tablet (25 mcg total) by mouth daily before breakfast. 06/28/19   Shon Hale, MD  mupirocin ointment (BACTROBAN) 2 % Apply 1 application topically 3 (three) times daily. 03/21/19   Wallis Bamberg, PA-C    nitroGLYCERIN (NITROSTAT) 0.4 MG SL tablet Place 1 tablet (0.4 mg total) under the tongue every 5 (five) minutes as needed for chest pain. 02/08/19 06/27/19  Jonelle Sidle, MD  varenicline (CHANTIX STARTING MONTH PAK) 0.5 MG X 11 & 1 MG X 42 tablet Take one 0.5 mg tablet by mouth once daily for 3 days, then increase to one 0.5 mg tablet twice daily for 4 days, then increase to one 1 mg tablet twice daily. 06/28/19   Shon Hale, MD    Family History Family History  Problem Relation Age of Onset  . COPD Mother   . High blood pressure Mother   . Cancer - Prostate Father   . High blood pressure Father     Social History Social History   Tobacco Use  . Smoking status: Current Every Day Smoker    Packs/day: 1.00    Years: 25.00    Pack years: 25.00    Types: Cigarettes  . Smokeless tobacco: Never Used  Vaping Use  . Vaping Use: Never used  Substance Use Topics  . Alcohol use: Never  . Drug use: Never     Allergies   Codeine and Hydrocodone-acetaminophen   Review of Systems Review of Systems   Physical Exam Triage Vital Signs ED Triage Vitals  Enc Vitals Group     BP 10/31/19 1937 (!) 141/92     Pulse Rate 10/31/19 1937 60     Resp 10/31/19 1937 16     Temp 10/31/19 1937 97.9 F (36.6 C)     Temp Source 10/31/19 1937 Oral     SpO2 10/31/19 1937 98 %     Weight 10/31/19 2007 171 lb 15.3 oz (78 kg)     Height 10/31/19 2007 6' (1.829 m)     Head Circumference --      Peak Flow --      Pain Score 10/31/19 2006 0     Pain Loc --      Pain Edu? --      Excl. in GC? --    No data found.  Updated Vital Signs BP (!) 141/92 (BP Location: Right Arm)   Pulse 60   Temp 97.9 F (36.6 C) (Oral)   Resp 16   Ht 6' (1.829 m)   Wt 171 lb 15.3 oz (78 kg)   SpO2 98%   BMI 23.32 kg/m   Visual Acuity Right Eye Distance:   Left Eye Distance:   Bilateral Distance:    Right Eye Near:   Left Eye Near:    Bilateral Near:     Physical Exam Vitals and nursing  note reviewed.  Constitutional:      Appearance: Normal appearance.  HENT:     Head: Normocephalic and atraumatic.     Nose: Nose  normal.  Eyes:     Conjunctiva/sclera: Conjunctivae normal.  Pulmonary:     Effort: Pulmonary effort is normal.  Abdominal:     Palpations: Abdomen is soft.     Tenderness: There is no abdominal tenderness.  Musculoskeletal:        General: Normal range of motion.     Cervical back: Normal range of motion.       Legs:  Skin:    General: Skin is warm and dry.  Neurological:     Mental Status: He is alert.  Psychiatric:        Mood and Affect: Mood normal.      UC Treatments / Results  Labs (all labs ordered are listed, but only abnormal results are displayed) Labs Reviewed - No data to display  EKG   Radiology DG Foot Complete Right  Result Date: 10/31/2019 CLINICAL DATA:  Quentin Mulling fell on foot and toe. Patient reports he dropped a 5 pound cement block onto great toe with pain and bruising. EXAM: RIGHT FOOT COMPLETE - 3+ VIEW COMPARISON:  None. FINDINGS: Comminuted mildly displaced great toe distal phalanx fracture. No intra-articular extension. No additional fracture of the foot. 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 There is soft tissue edema at the fracture site. IMPRESSION: Comminuted mildly displaced great toe distal phalanx fracture. Electronically Signed   By: Narda Rutherford M.D.   On: 10/31/2019 20:15    Procedures Procedures (including critical care time)  Medications Ordered in UC Medications - No data to display  Initial Impression / Assessment and Plan / UC Course  I have reviewed the triage vital signs and the nursing notes.  Pertinent labs & imaging results that were available during my care of the patient were reviewed by me and considered in my medical decision making (see chart for details).     Closed displaced fracture of distal phalanx of right great toe Cleaned wound and dressed here in  clinic. Will place in postop shoe and have him follow with orthopedics. Prophylactically placing on antibiotics based on history of diabetes and open wound and concern. Rest, ice, elevate. Final Clinical Impressions(s) / UC Diagnoses   Final diagnoses:  Closed displaced fracture of distal phalanx of right great toe, initial encounter     Discharge Instructions     You have a broken toe.  Giving you a shoe to wear  You need to stay off as much as possible Rest, ice and keep clean Take antibiotics as prescribed.      ED Prescriptions    Medication Sig Dispense Auth. Provider   cephALEXin (KEFLEX) 500 MG capsule Take 1 capsule (500 mg total) by mouth 4 (four) times daily. 28 capsule Maxine Huynh A, NP     PDMP not reviewed this encounter.   Dahlia Byes A, NP 11/01/19 1103

## 2019-11-01 NOTE — Telephone Encounter (Signed)
Patient called this morning for an immediate appointment.  I told him that we did not have any openings at this time unless someone called and canceled their appointment. I told him that I would be happy to call him if someone did cancel.   I also did suggest that he call [facility] to see if they had any openings.  He was very open to this suggestion.  I gave him the phone number and he will call them.

## 2019-11-02 ENCOUNTER — Encounter: Payer: Self-pay | Admitting: Orthopaedic Surgery

## 2019-11-02 ENCOUNTER — Ambulatory Visit: Payer: Managed Care, Other (non HMO) | Admitting: Orthopaedic Surgery

## 2019-11-02 ENCOUNTER — Other Ambulatory Visit: Payer: Self-pay

## 2019-11-02 VITALS — BP 144/91 | HR 64 | Ht 72.0 in | Wt 167.0 lb

## 2019-11-02 DIAGNOSIS — S92424A Nondisplaced fracture of distal phalanx of right great toe, initial encounter for closed fracture: Secondary | ICD-10-CM

## 2019-11-02 NOTE — Progress Notes (Signed)
Subjective:    Patient ID: Jeffery Beard, male    DOB: 10/26/1960, 59 y.o.   MRN: [id number]  HPI He had a paving stone land on his right foot and "crushed it".  He went to the ER shortly thereafter on 10-31-2019.   X-rays showed: IMPRESSION: Comminuted mildly displaced great toe distal phalanx fracture.  He is a brittle diabetic.  He has peripheral neuropathy of the feet.  He was given post op shoe.  I have reviewed the notes.  I have independently reviewed and interpreted x-rays of this patient done at another site by another physician or qualified health professional.  He is doing well with the post op shoe.  He has no other injury.  I have explained about his fracture and the crush injury of the foot.  He needs to look at his foot carefully every day tid to make sure he has no wound or problem as he is such a brittle diabetic.  He understands.  Review of Systems  Constitutional: Positive for activity change.  Musculoskeletal: Positive for arthralgias, gait problem and joint swelling.  All other systems reviewed and are negative.  For Review of Systems, all other systems reviewed and are negative.  The following is a summary of the past history medically, past history surgically, known current medicines, social history and family history.  This information is gathered electronically by the computer from prior information and documentation.  I review this each visit and have found including this information at this point in the chart is beneficial and informative.   Past Medical History:  Diagnosis Date  . Atrial fibrillation (HCC)    Details unclear - patient states diagnosed in his 48s  . Bicuspid aortic valve   . Hyperlipidemia   . Sarcoidosis   . Thoracic ascending aortic aneurysm (HCC)   . Type 2 diabetes mellitus (HCC)     Past Surgical History:  Procedure Laterality Date  . LUNG BIOPSY    . Nasal septum surgery    . PALATE / UVULA BIOPSY / EXCISION    .  TONSILLECTOMY    . VASECTOMY      Current Outpatient Medications on File Prior to Visit  Medication Sig Dispense Refill  . albuterol (VENTOLIN HFA) 108 (90 Base) MCG/ACT inhaler Inhale 1-2 puffs into the lungs every 6 (six) hours as needed for wheezing or shortness of breath. 18 g 0  . cephALEXin (KEFLEX) 500 MG capsule Take 1 capsule (500 mg total) by mouth 4 (four) times daily. 28 capsule 0  . Continuous Blood Gluc Sensor (FREESTYLE LIBRE 2 SENSOR SYSTM) MISC CHANGE SENSOR EVERY 14 DAYS 6 each 2  . diltiazem (CARDIZEM) 30 MG tablet Take 1 tablet (30 mg total) by mouth every 6 (six) hours as needed (Tachycardia/fast heart rate). 30 tablet 3  . glucose blood (ONETOUCH VERIO) test strip Use as instructed 4 x daily 150 each 2  . Insulin Glargine, 1 Unit Dial, (TOUJEO SOLOSTAR) 300 UNIT/ML SOPN Inject 66 Units into the skin at bedtime. (Patient taking differently: Inject 60 Units into the skin at bedtime. ) 27 mL 1  . Insulin Lispro (HUMALOG KWIKPEN) 200 UNIT/ML SOPN Inject 8-11 Units into the skin 3 (three) times daily before meals. (Patient taking differently: Inject 8-14 Units into the skin 3 (three) times daily before meals. ) 5 pen 5  . levothyroxine (SYNTHROID) 25 MCG tablet Take 1 tablet (25 mcg total) by mouth daily before breakfast. 30 tablet 6  . acetaminophen (TYLENOL) 650  MG CR tablet Take 650 mg by mouth every 8 (eight) hours as needed for pain.    Marland Kitchen apixaban (ELIQUIS) 5 MG TABS tablet Take 1 tablet (5 mg total) by mouth 2 (two) times daily. 60 tablet 11  . azithromycin (ZITHROMAX) 250 MG tablet Take 1 tablet (250 mg total) by mouth daily. Take first 2 tablets together, then 1 every day until finished. 6 tablet 0  . benzonatate (TESSALON) 100 MG capsule Take 1 capsule (100 mg total) by mouth every 8 (eight) hours. 21 capsule 0  . diltiazem (CARDIZEM CD) 180 MG 24 hr capsule Take 1 capsule (180 mg total) by mouth daily. 30 capsule 11  . mupirocin ointment (BACTROBAN) 2 % Apply 1  application topically 3 (three) times daily. 30 g 0  . nitroGLYCERIN (NITROSTAT) 0.4 MG SL tablet Place 1 tablet (0.4 mg total) under the tongue every 5 (five) minutes as needed for chest pain. 25 tablet 3  . varenicline (CHANTIX STARTING MONTH PAK) 0.5 MG X 11 & 1 MG X 42 tablet Take one 0.5 mg tablet by mouth once daily for 3 days, then increase to one 0.5 mg tablet twice daily for 4 days, then increase to one 1 mg tablet twice daily. 53 tablet 0   No current facility-administered medications on file prior to visit.    Social History   Socioeconomic History  . Marital status: Married    Spouse name: Not on file  . Number of children: Not on file  . Years of education: Not on file  . Highest education level: Not on file  Occupational History  . Not on file  Tobacco Use  . Smoking status: Current Every Day Smoker    Packs/day: 1.00    Years: 25.00    Pack years: 25.00    Types: Cigarettes  . Smokeless tobacco: Never Used  Vaping Use  . Vaping Use: Never used  Substance and Sexual Activity  . Alcohol use: Never  . Drug use: Never  . Sexual activity: Not on file  Other Topics Concern  . Not on file  Social History Narrative  . Not on file   Social Determinants of Health   Financial Resource Strain:   . Difficulty of Paying Living Expenses:   Food Insecurity:   . Worried About Programme researcher, broadcasting/film/video in the Last Year:   . Barista in the Last Year:   Transportation Needs:   . Freight forwarder (Medical):   Marland Kitchen Lack of Transportation (Non-Medical):   Physical Activity:   . Days of Exercise per Week:   . Minutes of Exercise per Session:   Stress:   . Feeling of Stress :   Social Connections:   . Frequency of Communication with Friends and Family:   . Frequency of Social Gatherings with Friends and Family:   . Attends Religious Services:   . Active Member of Clubs or Organizations:   . Attends Banker Meetings:   Marland Kitchen Marital Status:   Intimate  Partner Violence:   . Fear of Current or Ex-Partner:   . Emotionally Abused:   Marland Kitchen Physically Abused:   . Sexually Abused:     Family History  Problem Relation Age of Onset  . COPD Mother   . High blood pressure Mother   . Cancer - Prostate Father   . High blood pressure Father     BP (!) 144/91   Pulse 64   Ht 6' (1.829 m)  Wt 167 lb (75.8 kg)   BMI 22.65 kg/m   Body mass index is 22.65 kg/m.     Objective:   Physical Exam Vitals and nursing note reviewed.  Constitutional:      Appearance: He is well-developed.  HENT:     Head: Normocephalic and atraumatic.  Eyes:     Conjunctiva/sclera: Conjunctivae normal.     Pupils: Pupils are equal, round, and reactive to light.  Cardiovascular:     Rate and Rhythm: Normal rate and regular rhythm.  Pulmonary:     Effort: Pulmonary effort is normal.  Abdominal:     Palpations: Abdomen is soft.  Musculoskeletal:     Cervical back: Normal range of motion and neck supple.       Feet:  Skin:    General: Skin is warm and dry.  Neurological:     Mental Status: He is alert and oriented to person, place, and time.     Cranial Nerves: No cranial nerve deficit.     Motor: No abnormal muscle tone.     Coordination: Coordination normal.     Deep Tendon Reflexes: Reflexes are normal and symmetric. Reflexes normal.  Psychiatric:        Behavior: Behavior normal.        Thought Content: Thought content normal.        Judgment: Judgment normal.           Assessment & Plan:   Encounter Diagnosis  Name Primary?  . Closed nondisplaced fracture of distal phalanx of right great toe, initial encounter Yes   He is to continue the post op shoe.  Check toes daily tid.  Return in two weeks.  X-rays of the right great toe on return.  Call if any problem.  Precautions discussed.   Electronically Signed Darreld Mclean, MD 7/29/202111:55 AM

## 2019-11-02 NOTE — Patient Instructions (Signed)
OUT of WORK 

## 2019-11-16 ENCOUNTER — Encounter: Payer: Self-pay | Admitting: Orthopaedic Surgery

## 2019-11-16 ENCOUNTER — Other Ambulatory Visit: Payer: Self-pay

## 2019-11-16 ENCOUNTER — Ambulatory Visit: Payer: Managed Care, Other (non HMO)

## 2019-11-16 ENCOUNTER — Ambulatory Visit (INDEPENDENT_AMBULATORY_CARE_PROVIDER_SITE_OTHER): Payer: Managed Care, Other (non HMO) | Admitting: Orthopaedic Surgery

## 2019-11-16 VITALS — Ht 72.0 in | Wt 166.0 lb

## 2019-11-16 DIAGNOSIS — S92424D Nondisplaced fracture of distal phalanx of right great toe, subsequent encounter for fracture with routine healing: Secondary | ICD-10-CM | POA: Diagnosis not present

## 2019-11-16 NOTE — Progress Notes (Signed)
I am better  He is doing well with the fracture of the right great toe.  The wound is healed.  He has no redness.  He is using the post op shoe.  NV intact.  X-rays were done of the right great toe, reported separately.  Encounter Diagnosis  Name Primary?  . Nondisplaced fracture of distal phalanx of right great toe, subsequent encounter for fracture with routine healing Yes   Come out of post op shoe as tolerated.  Return in one month  X-rays of the right great toe on return.  Call if any problem.  Precautions discussed.   Electronically Signed Darreld Mclean, MD 8/12/20219:09 AM

## 2019-12-14 ENCOUNTER — Other Ambulatory Visit: Payer: Self-pay

## 2019-12-14 ENCOUNTER — Encounter: Payer: Self-pay | Admitting: Orthopaedic Surgery

## 2019-12-14 ENCOUNTER — Ambulatory Visit: Payer: Managed Care, Other (non HMO)

## 2019-12-14 ENCOUNTER — Ambulatory Visit: Payer: Managed Care, Other (non HMO) | Admitting: Orthopaedic Surgery

## 2019-12-14 VITALS — Ht 72.0 in | Wt 166.0 lb

## 2019-12-14 DIAGNOSIS — S92424D Nondisplaced fracture of distal phalanx of right great toe, subsequent encounter for fracture with routine healing: Secondary | ICD-10-CM | POA: Diagnosis not present

## 2019-12-14 NOTE — Progress Notes (Signed)
My toe is just a little tender at times  He has fracture of the great toe on the right.  He is doing well.  He has some slight tenderness but no pain.  He is wearing regular shoe.  NV intact.  X-rays were done of the right great toe, reported separately.  Encounter Diagnosis  Name Primary?  . Nondisplaced fracture of distal phalanx of right great toe, subsequent encounter for fracture with routine healing Yes   I have shown him his x-rays.  I will see him as needed.  Call if any problem.  Precautions discussed.   Electronically Signed Darreld Mclean, MD 9/9/20218:57 AM

## 2020-01-23 ENCOUNTER — Ambulatory Visit: Payer: Managed Care, Other (non HMO) | Admitting: Cardiology

## 2020-03-13 ENCOUNTER — Ambulatory Visit (INDEPENDENT_AMBULATORY_CARE_PROVIDER_SITE_OTHER): Payer: Managed Care, Other (non HMO) | Admitting: Cardiology

## 2020-03-13 ENCOUNTER — Encounter: Payer: Self-pay | Admitting: Cardiology

## 2020-03-13 VITALS — BP 178/90 | HR 73 | Ht 72.0 in | Wt 174.0 lb

## 2020-03-13 DIAGNOSIS — I712 Thoracic aortic aneurysm, without rupture, unspecified: Secondary | ICD-10-CM

## 2020-03-13 DIAGNOSIS — I7121 Aneurysm of the ascending aorta, without rupture: Secondary | ICD-10-CM

## 2020-03-13 DIAGNOSIS — I4819 Other persistent atrial fibrillation: Secondary | ICD-10-CM | POA: Diagnosis not present

## 2020-03-13 DIAGNOSIS — I350 Nonrheumatic aortic (valve) stenosis: Secondary | ICD-10-CM | POA: Diagnosis not present

## 2020-03-13 NOTE — Patient Instructions (Signed)
Your physician wants you to follow-up in: 6 MONTHS WITH DR MCDOWELL   Your physician recommends that you continue on your current medications as directed. Please refer to the Current Medication list given to you today.  Non-Cardiac CT Angiography (CTA), is a special type of CT scan that uses a computer to produce multi-dimensional views of major blood vessels throughout the body. In CT angiography, a contrast material is injected through an IV to help visualize the blood vessels  Thank you for choosing [facility]!!

## 2020-03-13 NOTE — Progress Notes (Signed)
Cardiology Office Note  Date: 03/13/2020   ID: Jeffery Beard, DOB Dec 08, 1960, MRN [id number]  PCP:  Benita Stabile, MD  Cardiologist:  Nona Dell, MD Electrophysiologist:  None   Chief Complaint  Patient presents with  . Cardiac follow-up    History of Present Illness: Jeffery Beard is a 59 y.o. male last seen in April.  He presents for a follow-up visit.  Denies any sense of palpitations or chest pain.  He is currently on insulin pump with follow-up by his PCP, states that blood glucose trend has gotten significantly better, still not optimal as yet.  He had a follow-up echocardiogram in [place] back in October that revealed LVEF greater than 65%, ascending aortic dilatation at 45 mm, and a bicuspid aortic valve with mild aortic regurgitation and mild aortic stenosis.  Chest CTA in March of this year revealed ascending thoracic aorta at 4.9 x 4.8 cm, described as stable and without dissection.  We discussed getting a follow-up study.  I went over his medications today.  He reports compliance with Eliquis, Cardizem CD has been further uptitrated.  I talked with him about considering addition of an ARB with his diabetes for renal protection and additional antihypertensive effect.  He stated that he would like to discuss this further with his PCP first.  Past Medical History:  Diagnosis Date  . Atrial fibrillation (HCC)    Details unclear - patient states diagnosed in his 61s  . Bicuspid aortic valve   . Hyperlipidemia   . Sarcoidosis   . Thoracic ascending aortic aneurysm (HCC)   . Type 2 diabetes mellitus (HCC)     Past Surgical History:  Procedure Laterality Date  . LUNG BIOPSY    . Nasal septum surgery    . PALATE / UVULA BIOPSY / EXCISION    . TONSILLECTOMY    . VASECTOMY      Current Outpatient Medications  Medication Sig Dispense Refill  . acetaminophen (TYLENOL) 650 MG CR tablet Take 650 mg by mouth every 8 (eight) hours as needed for pain.    Marland Kitchen  albuterol (VENTOLIN HFA) 108 (90 Base) MCG/ACT inhaler Inhale 1-2 puffs into the lungs every 6 (six) hours as needed for wheezing or shortness of breath. 18 g 0  . apixaban (ELIQUIS) 5 MG TABS tablet Take 1 tablet (5 mg total) by mouth 2 (two) times daily. 60 tablet 11  . diltiazem (CARDIZEM CD) 180 MG 24 hr capsule Take 1 capsule (180 mg total) by mouth daily. 30 capsule 11  . Insulin Human (INSULIN PUMP) SOLN Inject into the skin.    Marland Kitchen levothyroxine (SYNTHROID) 50 MCG tablet Take 50 mcg by mouth daily before breakfast.    . methocarbamol (ROBAXIN) 750 MG tablet Take 750 mg by mouth 4 (four) times daily.    . nitroGLYCERIN (NITROSTAT) 0.4 MG SL tablet Place 1 tablet (0.4 mg total) under the tongue every 5 (five) minutes as needed for chest pain. 25 tablet 3  . traMADol (ULTRAM) 50 MG tablet Take by mouth every 6 (six) hours as needed.     No current facility-administered medications for this visit.   Allergies:  Codeine and Hydrocodone-acetaminophen   ROS: No palpitations or syncope.  Physical Exam: VS:  BP (!) 178/90   Pulse 73   Ht 6' (1.829 m)   Wt 174 lb (78.9 kg)   SpO2 98%   BMI 23.60 kg/m , BMI Body mass index is 23.6 kg/m.  Wt Readings from Last 3  Encounters:  03/13/20 174 lb (78.9 kg)  12/14/19 166 lb (75.3 kg)  11/16/19 166 lb (75.3 kg)    General: Patient appears comfortable at rest. HEENT: Conjunctiva and lids normal, wearing a mask. Neck: Supple, no elevated JVP or carotid bruits, no thyromegaly. Lungs: Clear to auscultation, nonlabored breathing at rest. Cardiac: Regular rate and rhythm, no S3, 3/6 systolic murmur, no diastolic murmur, no pericardial rub. Extremities: No pitting edema.  ECG:  An ECG dated 06/27/2019 was personally reviewed today and demonstrated:  Atrial fibrillation with increased voltage and nonspecific T wave changes.  Recent Labwork: 06/27/2019: ALT 14; AST 14; BUN 11; Creatinine, Ser 0.80; Hemoglobin 15.0; Platelets 162; Potassium 3.6; Sodium  137; TSH 5.831     Component Value Date/Time   CHOL 141 01/22/2018 0000   TRIG 89 01/22/2018 0000   HDL 49 01/22/2018 0000   LDLCALC 79 01/22/2018 0000    Other Studies Reviewed Today:  Chest CTA 06/27/2019: IMPRESSION: 1.  No demonstrable pulmonary embolus.  2. Ascending thoracic aortic diameter of 4.9 x 4.8 cm, stable. There are foci of aortic atherosclerosis. No dissection. Slight coronary artery calcification noted. Ascending thoracic aortic aneurysm. Recommend semi-annual imaging followup by CTA or MRA and referral to cardiothoracic surgery if not already obtained. This recommendation follows 2010 ACCF/AHA/AATS/ACR/ASA/SCA/SCAI/SIR/STS/SVM Guidelines for the Diagnosis and Management of Patients With Thoracic Aortic Disease. Circulation. 2010; 121: I097-D532. Aortic aneurysm NOS (ICD10-I71.9).  3. Stable nodular opacities in the right upper lobe, largest measuring 1.4 x 1.3 cm. Question residua of sarcoidosis. No new nodular opacities evident. Underlying areas of scarring and bullous disease. No edema or airspace opacity.  4.  No demonstrable thoracic adenopathy.  Aortic Atherosclerosis (ICD10-I70.0).  Echocardiogram 01/26/2020 [facility]): LVEF greater than 65%, normal RV contraction, bicuspid aortic valve with mild aortic regurgitation and mild aortic stenosis, ascending aortic dilatation at 45 mm.  Assessment and Plan:  1.  Paroxysmal to persistent atrial fibrillation/flutter.  CHA2DS2-VASc score is 4.  He continues on Eliquis for stroke prophylaxis, heart rate control strategy on Cardizem CD.  His heart rate is in the 70s today.  2.  Bicuspid aortic valve with mild aortic stenosis and regurgitation.  Follow-up echocardiogram done in [place] in October is noted above with overall stable findings.  3.  Ascending thoracic aortic aneurysm, 4.9 x 4.8 cm as of CT imaging in March.  He has had follow-up with the CTS previously, due for a repeat scan which will be  arranged.  4.  Diabetes mellitus, currently on insulin pump with follow-up by PCP.  Would consider addition of ARB for renal protection and also better blood pressure control.  Will discuss with PCP first.  Medication Adjustments/Labs and Tests Ordered: Current medicines are reviewed at length with the patient today.  Concerns regarding medicines are outlined above.   Tests Ordered: Orders Placed This Encounter  Procedures  . CT ANGIO CHEST AORTA W/CM & OR WO/CM    Medication Changes: No orders of the defined types were placed in this encounter.   Disposition:  Follow up 6 months.  Signed, Jonelle Sidle, MD, [facility] 03/13/2020 4:52 PM    [facility] at [facility] [address] [place], [place], [place] [address] Phone: [phone]; Fax: [phone]

## 2020-03-16 ENCOUNTER — Encounter (HOSPITAL_COMMUNITY): Payer: Self-pay

## 2020-03-16 ENCOUNTER — Emergency Department (HOSPITAL_COMMUNITY): Payer: Managed Care, Other (non HMO)

## 2020-03-16 ENCOUNTER — Emergency Department (HOSPITAL_COMMUNITY)
Admission: EM | Admit: 2020-03-16 | Discharge: 2020-03-16 | Disposition: A | Discharge status: Home / Self Care | Payer: Managed Care, Other (non HMO) | Attending: Emergency Medicine | Admitting: Emergency Medicine

## 2020-03-16 DIAGNOSIS — E039 Hypothyroidism, unspecified: Secondary | ICD-10-CM | POA: Insufficient documentation

## 2020-03-16 DIAGNOSIS — G459 Transient cerebral ischemic attack, unspecified: Secondary | ICD-10-CM | POA: Diagnosis not present

## 2020-03-16 DIAGNOSIS — F17210 Nicotine dependence, cigarettes, uncomplicated: Secondary | ICD-10-CM | POA: Insufficient documentation

## 2020-03-16 DIAGNOSIS — R202 Paresthesia of skin: Secondary | ICD-10-CM | POA: Diagnosis present

## 2020-03-16 DIAGNOSIS — Z79899 Other long term (current) drug therapy: Secondary | ICD-10-CM | POA: Diagnosis not present

## 2020-03-16 DIAGNOSIS — E1169 Type 2 diabetes mellitus with other specified complication: Secondary | ICD-10-CM | POA: Diagnosis not present

## 2020-03-16 DIAGNOSIS — Z7901 Long term (current) use of anticoagulants: Secondary | ICD-10-CM | POA: Insufficient documentation

## 2020-03-16 DIAGNOSIS — E782 Mixed hyperlipidemia: Secondary | ICD-10-CM | POA: Diagnosis not present

## 2020-03-16 DIAGNOSIS — Z794 Long term (current) use of insulin: Secondary | ICD-10-CM | POA: Diagnosis not present

## 2020-03-16 HISTORY — DX: Cerebral infarction, unspecified: I63.9

## 2020-03-16 LAB — URINALYSIS, ROUTINE W REFLEX MICROSCOPIC
Bacteria, UA: NONE SEEN
Bilirubin Urine: NEGATIVE
Glucose, UA: >=500 mg/dL — AB
Hgb urine dipstick: NEGATIVE
Ketones, ur: NEGATIVE mg/dL
Leukocytes,Ua: NEGATIVE
Nitrite: NEGATIVE
Protein, ur: NEGATIVE mg/dL
Specific Gravity, Urine: 1.001 — ABNORMAL LOW (ref 1.005–1.030)
pH: 6 (ref 5.0–8.0)

## 2020-03-16 LAB — CBC
HCT: 45.4 % (ref 39.0–52.0)
Hemoglobin: 15.1 g/dL (ref 13.0–17.0)
MCH: 30.8 pg (ref 26.0–34.0)
MCHC: 33.3 g/dL (ref 30.0–36.0)
MCV: 92.5 fL (ref 80.0–100.0)
Platelets: 173 10*3/uL (ref 150–400)
RBC: 4.91 MIL/uL (ref 4.22–5.81)
RDW: 12.8 % (ref 11.5–15.5)
WBC: 6.3 10*3/uL (ref 4.0–10.5)
nRBC: 0 % (ref 0.0–0.2)

## 2020-03-16 LAB — DIFFERENTIAL
Abs Immature Granulocytes: 0.02 10*3/uL (ref 0.00–0.07)
Basophils Absolute: 0.1 10*3/uL (ref 0.0–0.1)
Basophils Relative: 1 %
Eosinophils Absolute: 0.3 10*3/uL (ref 0.0–0.5)
Eosinophils Relative: 4 %
Immature Granulocytes: 0 %
Lymphocytes Relative: 22 %
Lymphs Abs: 1.4 10*3/uL (ref 0.7–4.0)
Monocytes Absolute: 0.5 10*3/uL (ref 0.1–1.0)
Monocytes Relative: 8 %
Neutro Abs: 4 10*3/uL (ref 1.7–7.7)
Neutrophils Relative %: 65 %

## 2020-03-16 LAB — COMPREHENSIVE METABOLIC PANEL
ALT: 13 U/L (ref 0–44)
AST: 14 U/L — ABNORMAL LOW (ref 15–41)
Albumin: 4.4 g/dL (ref 3.5–5.0)
Alkaline Phosphatase: 46 U/L (ref 38–126)
Anion gap: 7 (ref 5–15)
BUN: 12 mg/dL (ref 6–20)
CO2: 28 mmol/L (ref 22–32)
Calcium: 9.6 mg/dL (ref 8.9–10.3)
Chloride: 101 mmol/L (ref 98–111)
Creatinine, Ser: 0.82 mg/dL (ref 0.61–1.24)
GFR, Estimated: >60 mL/min (ref >60)
Glucose, Bld: 261 mg/dL — ABNORMAL HIGH (ref 70–99)
Potassium: 4.2 mmol/L (ref 3.5–5.1)
Sodium: 136 mmol/L (ref 135–145)
Total Bilirubin: 0.6 mg/dL (ref 0.3–1.2)
Total Protein: 7.7 g/dL (ref 6.5–8.1)

## 2020-03-16 LAB — PROTIME-INR
INR: 1.1 (ref 0.8–1.2)
Prothrombin Time: 13.3 seconds (ref 11.4–15.2)

## 2020-03-16 LAB — APTT: aPTT: 34 seconds (ref 24–36)

## 2020-03-16 MED ORDER — IOHEXOL 350 MG/ML SOLN
75.0000 mL | Freq: Once | INTRAVENOUS | Status: AC | PRN
  Administered 2020-03-16: 75 mL via INTRAVENOUS

## 2020-03-16 NOTE — ED Provider Notes (Signed)
[facility] EMERGENCY DEPARTMENT Provider Note   CSN: [id number] Arrival date & time: 03/16/20  9767     History Chief Complaint  Patient presents with  . Numbness    Jeffery Beard is a 59 y.o. male with pertinent past medical history significant for persistent A. fib on Eliquis, HLD, type 2 diabetes, CVA 2 months ago, hypothyroidism, sarcoidosis that presents emergency department today for acute onset of numbness and tingling on his right side that started from his face that spread down to his right upper extremity and down to his right lower extremity this morning.  States that he was still able to use the extremities without any weakness, able to walk.  States that it lasted about 3 minutes and then completely resolved.  Patient states that he also felt a metallic taste in his tongue when this occurred.  States that it was distinctly only on the right side of his face and the right side of his entire body.  Denied any facial droop, drift, neglect, aphasia, confusion.  Denies any head trauma.  Denies any pain anywhere.  Denies any vision changes or headache.  Denies any dizziness.  States that he did have one drink last night, no other drug or alcohol use.  Denies any fevers or chills.  States that from his stroke up until now he has been doing well.  Has been taking his medications including his anticoagulants.  Denies any seizure-like activity.  Denies any head trauma.  States that he is currently symptom-free.  Unable to see patients prior records, he states that he was treated in [place].  States that he had a cerebellar stroke 2 months ago, no residual deficits. Does not feel like prior stroke.  HPI     Past Medical History:  Diagnosis Date  . Atrial fibrillation (HCC)    Details unclear - patient states diagnosed in his 17s  . Bicuspid aortic valve   . Hyperlipidemia   . Sarcoidosis   . Stroke (HCC)   . Thoracic ascending aortic aneurysm (HCC)   . Type 2 diabetes mellitus  [facility])     Patient Active Problem List   Diagnosis Date Noted  . Atrial fibrillation (HCC) 06/27/2019  . LADA (latent autoimmune diabetes in adults), managed as type 1 (HCC) 09/06/2018  . Hypothyroidism 09/06/2018  . Bicuspid aortic valve   . Nonrheumatic aortic valve stenosis   . Thoracic aortic aneurysm without rupture (HCC)   . Essential hypertension   . Sarcoidosis   . Type 2 diabetes mellitus (HCC)   . Atrial fibrillation with RVR (HCC) 08/14/2018  . Mixed hyperlipidemia 03/24/2018  . Current smoker 03/24/2018    Past Surgical History:  Procedure Laterality Date  . LUNG BIOPSY    . Nasal septum surgery    . PALATE / UVULA BIOPSY / EXCISION    . TONSILLECTOMY    . VASECTOMY         Family History  Problem Relation Age of Onset  . COPD Mother   . High blood pressure Mother   . Cancer - Prostate Father   . High blood pressure Father     Social History   Tobacco Use  . Smoking status: Current Every Day Smoker    Packs/day: 1.00    Years: 25.00    Pack years: 25.00    Types: Cigarettes  . Smokeless tobacco: Never Used  Vaping Use  . Vaping Use: Never used  Substance Use Topics  . Alcohol use: Never  .  Drug use: Never    Home Medications Prior to Admission medications   Medication Sig Start Date End Date Taking? Authorizing Provider  acetaminophen (TYLENOL) 650 MG CR tablet Take 650 mg by mouth every 8 (eight) hours as needed for pain.    [provider]  albuterol (VENTOLIN HFA) 108 (90 Base) MCG/ACT inhaler Inhale 1-2 puffs into the lungs every 6 (six) hours as needed for wheezing or shortness of breath. 09/01/19   Wurst, GrenadaBritt[place], PA-C  apixaban (ELIQUIS) 5 MG TABS tablet Take 1 tablet (5 mg total) by mouth 2 (two) times daily. 06/28/19 03/13/20  Shon HaleEmokpae, Courage, MD  diltiazem (CARDIZEM CD) 180 MG 24 hr capsule Take 1 capsule (180 mg total) by mouth daily. 06/28/19 06/27/20  Shon HaleEmokpae, Courage, MD  Insulin Human (INSULIN PUMP) SOLN Inject into the  skin.    [provider]  levothyroxine (SYNTHROID) 50 MCG tablet Take 50 mcg by mouth daily before breakfast.    [provider]  methocarbamol (ROBAXIN) 750 MG tablet Take 750 mg by mouth 4 (four) times daily.    [provider]  nitroGLYCERIN (NITROSTAT) 0.4 MG SL tablet Place 1 tablet (0.4 mg total) under the tongue every 5 (five) minutes as needed for chest pain. 02/08/19 03/13/20  Jonelle SidleMcDowell, Samuel G, MD  traMADol (ULTRAM) 50 MG tablet Take by mouth every 6 (six) hours as needed.    [provider]    Allergies    Codeine and Hydrocodone-acetaminophen  Review of Systems   Review of Systems  Constitutional: Negative for chills, diaphoresis, fatigue and fever.  HENT: Negative for congestion, sore throat and trouble swallowing.   Eyes: Negative for pain and visual disturbance.  Respiratory: Negative for cough, shortness of breath and wheezing.   Cardiovascular: Negative for chest pain, palpitations and leg swelling.  Gastrointestinal: Negative for abdominal distention, abdominal pain, diarrhea, nausea and vomiting.  Genitourinary: Negative for difficulty urinating.  Musculoskeletal: Negative for back pain, neck pain and neck stiffness.  Skin: Negative for pallor.  Neurological: Positive for numbness. Negative for dizziness, speech difficulty, weakness and headaches.  Psychiatric/Behavioral: Negative for confusion.    Physical Exam Updated Vital Signs BP 126/88 (BP Location: Right Arm)   Pulse 69   Temp 98 F (36.7 C) (Oral)   Resp 18   Ht 6' (1.829 m)   Wt 77.1 kg   SpO2 100%   BMI 23.06 kg/m   Physical Exam Constitutional:      General: He is not in acute distress.    Appearance: Normal appearance. He is not ill-appearing, toxic-appearing or diaphoretic.  HENT:     Head: Normocephalic and atraumatic.     Mouth/Throat:     Mouth: Mucous membranes are moist.     Pharynx: Oropharynx is clear.  Eyes:     General: No scleral icterus.     Extraocular Movements: Extraocular movements intact.     Pupils: Pupils are equal, round, and reactive to light.  Cardiovascular:     Rate and Rhythm: Normal rate and regular rhythm.     Pulses: Normal pulses.     Heart sounds: Normal heart sounds.  Pulmonary:     Effort: Pulmonary effort is normal. No respiratory distress.     Breath sounds: Normal breath sounds. No stridor. No wheezing, rhonchi or rales.  Chest:     Chest wall: No tenderness.  Abdominal:     General: Abdomen is flat. There is no distension.     Palpations: Abdomen is soft.  Tenderness: There is no abdominal tenderness. There is no guarding or rebound.  Musculoskeletal:        General: No swelling or tenderness. Normal range of motion.     Cervical back: Normal range of motion and neck supple. No rigidity.     Right lower leg: No edema.     Left lower leg: No edema.  Skin:    General: Skin is warm and dry.     Capillary Refill: Capillary refill takes less than 2 seconds.     Coloration: Skin is not pale.  Neurological:     General: No focal deficit present.     Mental Status: He is alert and oriented to person, place, and time.     Comments: Alert. Clear speech. No facial droop. CNIII-XII grossly intact. Bilateral upper and lower extremities' sensation grossly intact. 5/5 symmetric strength with grip strength and with plantar and dorsi flexion bilaterally. Patellar DTRs are 2+ and symmetric . Normal finger to nose bilaterally. Negative pronator drift. Negative Romberg sign. Gait is steady and intact    Psychiatric:        Mood and Affect: Mood normal.        Behavior: Behavior normal.     ED Results / Procedures / Treatments   Labs (all labs ordered are listed, but only abnormal results are displayed) Labs Reviewed  COMPREHENSIVE METABOLIC PANEL - Abnormal; Notable for the following components:      Result Value   Glucose, Bld 261 (*)    AST 14 (*)    All other components within normal limits   URINALYSIS, ROUTINE W REFLEX MICROSCOPIC - Abnormal; Notable for the following components:   Color, Urine COLORLESS (*)    Specific Gravity, Urine 1.001 (*)    Glucose, UA >=500 (*)    All other components within normal limits  PROTIME-INR  APTT  CBC  DIFFERENTIAL    EKG EKG Interpretation  Date/Time:  Saturday March 16 2020 11:17:20 EST Ventricular Rate:  86 PR Interval:  156 QRS Duration: 90 QT Interval:  352 QTC Calculation: 421 R Axis:   75 Text Interpretation: Normal sinus rhythm Right atrial enlargement Left ventricular hypertrophy with repolarization abnormality ( Sokolow-Lyon ) Abnormal ECG Poor data quality in current ECG precludes serial comparison Confirmed by Meridee Score [phone]) on 03/16/2020 11:26:16 AM   Radiology CT Angio Head W/Cm &/Or Wo Cm  Result Date: 03/16/2020 CLINICAL DATA:  Transient ischemic attack (TIA) EXAM: CT ANGIOGRAPHY HEAD AND NECK TECHNIQUE: Multidetector CT imaging of the head and neck was performed using the standard protocol during bolus administration of intravenous contrast. Multiplanar CT image reconstructions and MIPs were obtained to evaluate the vascular anatomy. Carotid stenosis measurements (when applicable) are obtained utilizing NASCET criteria, using the distal internal carotid diameter as the denominator. CONTRAST:  47mL OMNIPAQUE IOHEXOL 350 MG/ML SOLN COMPARISON:  03/16/2020 head CT. FINDINGS: CTA NECK FINDINGS Aortic arch: Standard branching. Imaged portion shows no evidence of aneurysm or dissection. No significant stenosis of the major arch vessel origins. Minimal atherosclerotic calcifications. Right carotid system: Patent. Calcified and noncalcified atheromatous disease of the carotid bifurcation with 30% proximal ICA luminal narrowing. Left carotid system: Patent. Mild bifurcation atheromatous disease without significant luminal narrowing. Vertebral arteries: Codominant and patent. Skeleton: No acute finding. Other neck: No  adenopathy.  Posterior neck lipoma. Upper chest: Right upper lung nodular 1.3 cm opacity, unchanged. No acute finding. Review of the MIP images confirms the above findings CTA HEAD FINDINGS Anterior circulation: Patent ICAs.  Patent MCAs.  Patent ACAs. Posterior circulation: Patent V4 segments and PICA. Patent basilar and superior cerebellar arteries. Patent bilateral PCAs. Venous sinuses: As permitted by contrast timing, patent. Anatomic variants: None. Review of the MIP images confirms the above findings IMPRESSION: No large vessel occlusion, high-grade narrowing, aneurysm or dissection within the head or neck. 30% proximal right ICA luminal narrowing. 1.3 cm right apical nodule, unchanged. Electronically Signed   By: Stana Bunting M.D.   On: 03/16/2020 13:35   CT HEAD WO CONTRAST  Result Date: 03/16/2020 CLINICAL DATA:  TIA symptoms, right paresthesias EXAM: CT HEAD WITHOUT CONTRAST TECHNIQUE: Contiguous axial images were obtained from the base of the skull through the vertex without intravenous contrast. COMPARISON:  None available FINDINGS: Brain: Minimal areas of subcortical white matter patchy hypodensity, favored to be chronic microvascular ischemic change. No acute intracranial hemorrhage, definite acute infarction, mass lesion, herniation, hydrocephalus, or extra-axial fluid collection. Cisterns are patent. Superior right cerebellar focal hypodensity/encephalomalacia compatible with remote infarct. Vascular: No hyperdense vessel or unexpected calcification. Skull: Normal. Negative for fracture or focal lesion. Sinuses/Orbits: No acute finding. Other: None. IMPRESSION: No acute intracranial abnormality by noncontrast CT. Minimal white matter subcortical microvascular ischemic changes. Remote right superior cerebellar infarct with focal encephalomalacia. Electronically Signed   By: Judie Petit.  Shick M.D.   On: 03/16/2020 11:06   CT Angio Neck W and/or Wo Contrast  Result Date: 03/16/2020 CLINICAL  DATA:  Transient ischemic attack (TIA) EXAM: CT ANGIOGRAPHY HEAD AND NECK TECHNIQUE: Multidetector CT imaging of the head and neck was performed using the standard protocol during bolus administration of intravenous contrast. Multiplanar CT image reconstructions and MIPs were obtained to evaluate the vascular anatomy. Carotid stenosis measurements (when applicable) are obtained utilizing NASCET criteria, using the distal internal carotid diameter as the denominator. CONTRAST:  68mL OMNIPAQUE IOHEXOL 350 MG/ML SOLN COMPARISON:  03/16/2020 head CT. FINDINGS: CTA NECK FINDINGS Aortic arch: Standard branching. Imaged portion shows no evidence of aneurysm or dissection. No significant stenosis of the major arch vessel origins. Minimal atherosclerotic calcifications. Right carotid system: Patent. Calcified and noncalcified atheromatous disease of the carotid bifurcation with 30% proximal ICA luminal narrowing. Left carotid system: Patent. Mild bifurcation atheromatous disease without significant luminal narrowing. Vertebral arteries: Codominant and patent. Skeleton: No acute finding. Other neck: No adenopathy.  Posterior neck lipoma. Upper chest: Right upper lung nodular 1.3 cm opacity, unchanged. No acute finding. Review of the MIP images confirms the above findings CTA HEAD FINDINGS Anterior circulation: Patent ICAs.  Patent MCAs.  Patent ACAs. Posterior circulation: Patent V4 segments and PICA. Patent basilar and superior cerebellar arteries. Patent bilateral PCAs. Venous sinuses: As permitted by contrast timing, patent. Anatomic variants: None. Review of the MIP images confirms the above findings IMPRESSION: No large vessel occlusion, high-grade narrowing, aneurysm or dissection within the head or neck. 30% proximal right ICA luminal narrowing. 1.3 cm right apical nodule, unchanged. Electronically Signed   By: Stana Bunting M.D.   On: 03/16/2020 13:35    Procedures Procedures (including critical care  time)  Medications Ordered in ED Medications  iohexol (OMNIPAQUE) 350 MG/ML injection 75 mL (75 mLs Intravenous Contrast Given 03/16/20 1255)    ED Course  I have reviewed the triage vital signs and the nursing notes.  Pertinent labs & imaging results that were available during my care of the patient were reviewed by me and considered in my medical decision making (see chart for details).  Clinical Course as of 03/16/20 1351  Sat Mar 16, 2020  2634 59 year old male prior history of stroke here with about 2 minutes of right face arm and leg numbness that has since resolved.  No prior history of same.  Was not associated with any slurred speech or change in vision.  Getting labs head CT EKG and will review with neurology. [MB]    Clinical Course User Index [MB] Terrilee Files, MD   MDM Rules/Calculators/A&P   ABCD2 Score: 4                     Jeffery Beard is a 59 y.o. male with pertinent past medical history significant for persistent A. fib on Eliquis, HLD, type 2 diabetes, CVA 2 months ago, hypothyroidism, sarcoidosis that presents emergency department today for acute onset of numbness and tingling on his right side that started from his face that spread down to his right upper extremity and down to his right lower extremity this morning. ABCD2 score 4. Will consult neurology at this time.   1145 spoke to Dr. Amada Jupiter, neurology who recommends CTA head and neck. Recommends that if this is negative then patient can be discharged with neurology follow-up.  Work-up today negative with CTA head and neck without any major stenosis.  Patient to be discharged with neurology follow-up.  Symptomatic treatment discussed, strict return precautions given.  Doubt need for further emergent work up at this time. I explained the diagnosis and have given explicit precautions to return to the ER including for any other new or worsening symptoms. The patient understands and accepts the medical  plan as it's been dictated and I have answered their questions. Discharge instructions concerning home care and prescriptions have been given. The patient is STABLE and is discharged to home in good condition.  I discussed this case with my attending physician who cosigned this note including patient's presenting symptoms, physical exam, and planned diagnostics and interventions. Attending physician stated agreement with plan or made changes to plan which were implemented.   Attending physician assessed patient at bedside.   Final Clinical Impression(s) / ED Diagnoses Final diagnoses:  TIA (transient ischemic attack)    Rx / DC Orders ED Discharge Orders    None       Farrel Gordon, PA-C 03/16/20 1351    Terrilee Files, MD 03/16/20 1810

## 2020-03-16 NOTE — ED Triage Notes (Signed)
Pt reports right sided numbness/tingling at 0845 that started from his face and down to arm and leg. Pt reports lasted 3 mins and resolved. Pt took elquis this morning. Pt reports that he had a stroke 2 months ago and was placed on blood thinners. All deficits resolved. Pt reports he is limping due to injury to left leg

## 2020-03-16 NOTE — Discharge Instructions (Addendum)
Your work-up was reassuring, you most likely had TIA as we discussed.  Continue to take your blood thinning medication, I want you to follow-up with neurology in the next couple of days.  Please follow-up with your neurologist, if you do not have 1 you can go see 1 from [facility].  If you have any new or worsening concerning symptoms please come back to the emergency department.  You can use the attached instructions. Your CT findings are below.    IMPRESSION:  No large vessel occlusion, high-grade narrowing, aneurysm or  dissection within the head or neck.     30% proximal right ICA luminal narrowing.     1.3 cm right apical nodule, unchanged.

## 2020-03-16 NOTE — Progress Notes (Signed)
  CT tech informed pt of current guidelines that recommend he remove external medical devices prior to CT scan. Pt voiced understanding but refused to remove devices.

## 2020-04-02 ENCOUNTER — Other Ambulatory Visit: Payer: Self-pay

## 2020-04-02 ENCOUNTER — Ambulatory Visit
Admission: RE | Admit: 2020-04-02 | Discharge: 2020-04-02 | Disposition: A | Discharge status: Home / Self Care | Payer: Managed Care, Other (non HMO) | Source: Ambulatory Visit | Attending: Cardiology | Admitting: Cardiology

## 2020-04-02 DIAGNOSIS — I712 Thoracic aortic aneurysm, without rupture, unspecified: Secondary | ICD-10-CM

## 2020-04-02 MED ORDER — IOHEXOL 350 MG/ML SOLN
75.0000 mL | Freq: Once | INTRAVENOUS | Status: AC | PRN
  Administered 2020-04-02: 11:00:00 75 mL via INTRAVENOUS

## 2020-04-11 ENCOUNTER — Telehealth: Payer: Self-pay | Admitting: Cardiology

## 2020-04-11 NOTE — Telephone Encounter (Signed)
Patient informed. Copy sent to PCP  Advised to contact his PCP about nausea and fatigue

## 2020-04-11 NOTE — Telephone Encounter (Signed)
In the evenings when pt gets home his heart rate is around 50-60's and he's getting nauseous, has had been fatigued to the point of not wanting to get out of the car.   Also wanting to get his CT results   Please call [phone] please leave message if he does not answer

## 2020-04-11 NOTE — Telephone Encounter (Signed)
-----   Message from Ellsworth Lennox, [place] sent at 04/02/2020  5:03 PM EST ----- Patient of Dr. Diona Browner - Please let the patient know that his repeat CT scan shows that his ascending thoracic aortic aneurysm has actually decreased in size when compared to prior imaging.  This was previously at 4.9 cm and is now at 4.6 cm. Would anticipate repeat imaging in 6 months. He did have coronary artery calcifications and this has been noted on prior imaging as well.  Would recommend possible ischemic evaluation for screening purposes be reviewed at the time of his next visit.

## 2020-09-04 NOTE — Progress Notes (Deleted)
Cardiology Office Note  Date: 09/04/2020   ID: Jeffery Beard, DOB 1961/01/19, MRN [id number]  PCP:  Benita Stabile, MD  Cardiologist:  Nona Dell, MD Electrophysiologist:  None   No chief complaint on file.   History of Present Illness: Jeffery Beard is a 60 y.o. male last seen in December 2021.  Follow-up chest CTA in December 2021 showed no significant change in fusiform ascending aortic aneurysm measuring 46 mm in the setting of bicuspid aortic valve and moderate coronary calcification.  Past Medical History:  Diagnosis Date  . Atrial fibrillation (HCC)    Details unclear - patient states diagnosed in his 34s  . Bicuspid aortic valve   . Hyperlipidemia   . Sarcoidosis   . Stroke (HCC)   . Thoracic ascending aortic aneurysm (HCC)   . Type 2 diabetes mellitus (HCC)     Past Surgical History:  Procedure Laterality Date  . LUNG BIOPSY    . Nasal septum surgery    . PALATE / UVULA BIOPSY / EXCISION    . TONSILLECTOMY    . VASECTOMY      Current Outpatient Medications  Medication Sig Dispense Refill  . acetaminophen (TYLENOL) 650 MG CR tablet Take 650 mg by mouth every 8 (eight) hours as needed for pain.    Marland Kitchen albuterol (VENTOLIN HFA) 108 (90 Base) MCG/ACT inhaler Inhale 1-2 puffs into the lungs every 6 (six) hours as needed for wheezing or shortness of breath. 18 g 0  . apixaban (ELIQUIS) 5 MG TABS tablet Take 1 tablet (5 mg total) by mouth 2 (two) times daily. 60 tablet 11  . diltiazem (CARDIZEM CD) 180 MG 24 hr capsule Take 1 capsule (180 mg total) by mouth daily. 30 capsule 11  . Insulin Human (INSULIN PUMP) SOLN Inject into the skin.    Marland Kitchen levothyroxine (SYNTHROID) 50 MCG tablet Take 50 mcg by mouth daily before breakfast.    . methocarbamol (ROBAXIN) 750 MG tablet Take 750 mg by mouth 4 (four) times daily.    . nitroGLYCERIN (NITROSTAT) 0.4 MG SL tablet Place 1 tablet (0.4 mg total) under the tongue every 5 (five) minutes as needed for chest pain. 25  tablet 3  . traMADol (ULTRAM) 50 MG tablet Take by mouth every 6 (six) hours as needed.     No current facility-administered medications for this visit.   Allergies:  Codeine, Hydrocodone-acetaminophen, and Contrast media [iodinated diagnostic agents]   Social History: The patient  reports that he has been smoking cigarettes. He has a 25.00 pack-year smoking history. He has never used smokeless tobacco. He reports that he does not drink alcohol and does not use drugs.   Family History: The patient's family history includes COPD in his mother; Cancer - Prostate in his father; High blood pressure in his father and mother.   ROS:  Please see the history of present illness. Otherwise, complete review of systems is positive for {NONE DEFAULTED:18576::"none"}.  All other systems are reviewed and negative.   Physical Exam: VS:  There were no vitals taken for this visit., BMI There is no height or weight on file to calculate BMI.  Wt Readings from Last 3 Encounters:  03/16/20 170 lb (77.1 kg)  03/13/20 174 lb (78.9 kg)  12/14/19 166 lb (75.3 kg)    General: Patient appears comfortable at rest. HEENT: Conjunctiva and lids normal, oropharynx clear with moist mucosa. Neck: Supple, no elevated JVP or carotid bruits, no thyromegaly. Lungs: Clear to auscultation, nonlabored breathing at  rest. Cardiac: Regular rate and rhythm, no S3 or significant systolic murmur, no pericardial rub. Abdomen: Soft, nontender, no hepatomegaly, bowel sounds present, no guarding or rebound. Extremities: No pitting edema, distal pulses 2+. Skin: Warm and dry. Musculoskeletal: No kyphosis. Neuropsychiatric: Alert and oriented x3, affect grossly appropriate.  ECG:  An ECG dated 03/16/2020 was personally reviewed today and demonstrated:  Sinus rhythm with increased voltage and repolarization abnormalities.  Recent Labwork: 03/16/2020: ALT 13; AST 14; BUN 12; Creatinine, Ser 0.82; Hemoglobin 15.1; Platelets 173;  Potassium 4.2; Sodium 136     Component Value Date/Time   CHOL 141 01/22/2018 0000   TRIG 89 01/22/2018 0000   HDL 49 01/22/2018 0000   LDLCALC 79 01/22/2018 0000    Other Studies Reviewed Today:  Echocardiogram 01/26/2020 [facility]): LVEF greater than 65%, normal RV contraction, bicuspid aortic valve with mild aortic regurgitation and mild aortic stenosis, ascending aortic dilatation at 45 mm.  Chest CTA 04/02/2020: FINDINGS: Cardiovascular: Preferential opacification of the thoracic aorta. The heart is normal in size. No pericardial effusion.  Sinues of Valsalva: 30 mm 30 x 33 mm ,unchanged. Possible bicuspid valve, limited evaluation due to motion artifact. Similar-appearing valvular calcifications.  Sinotubular Junction: 36 mm ,unchanged  Ascending Aorta: 46 mm ,unchanged by my measurements  Aortic Arch: 28 mm ,unchanged  Descending aorta: 27 mm at the level of the carina ,unchanged  Branch vessels: Conventional branching pattern. No significant atherosclerotic changes.  Coronary arteries: Normal origins and courses. Moderate atherosclerotic calcifications.  Main pulmonary artery: 21 mm ,unchanged. No evidence of central pulmonary embolism.  Pulmonary veins: No anomalous pulmonary venous return. No evidence of left atrial appendage thrombus.  Mediastinum/Nodes: No enlarged mediastinal, hilar, or axillary lymph nodes. Thyroid gland, trachea, and esophagus demonstrate no significant findings.  Lungs/Pleura: Similar appearing moderate upper lobe predominant centrilobular and bullous emphysema. Unchanged nodular opacity in the right upper lobe measuring approximately 14 x 11 mm in axial dimensions. Similar appearing subpleural scarring in the lateral aspect of the right upper lobe. No new pulmonary nodules. No pleural effusion pneumothorax.  Upper Abdomen: The visualized upper abdomen is within normal limits.  Musculoskeletal: No chest wall  abnormality. No acute or significant osseous findings.  Review of the MIP images confirms the above findings.  IMPRESSION: Vascular:  1. Unchanged fusiform ascending aortic aneurysm measuring up to 46 mm. Recommend semi-annual imaging followup by CTA or MRA and referral to cardiothoracic surgery if not already obtained. This recommendation follows 2010 ACCF/AHA/AATS/ACR/ASA/SCA/SCAI/SIR/STS/SVM Guidelines for the Diagnosis and Management of Patients With Thoracic Aortic Disease. Circulation. 2010; 121: P295-J884. Aortic aneurysm NOS (ICD10-I71.9) 2. Calcifications about the bicuspid-appearing aortic valve. 3. Moderate coronary atherosclerosis.  Non-Vascular:  1. Unchanged nodular opacities in the right lobe upper lobe. 2. Unchanged bullous emphysematous changes.  Assessment and Plan:    Medication Adjustments/Labs and Tests Ordered: Current medicines are reviewed at length with the patient today.  Concerns regarding medicines are outlined above.   Tests Ordered: No orders of the defined types were placed in this encounter.   Medication Changes: No orders of the defined types were placed in this encounter.   Disposition:  Follow up {follow up:15908}  Signed, Jonelle Sidle, MD, [facility] 09/04/2020 3:03 PM    [facility] at [facility] [address] [place], [place], [place] [address] Phone: [phone]; Fax: [phone]

## 2020-09-05 ENCOUNTER — Ambulatory Visit: Payer: Managed Care, Other (non HMO) | Admitting: Cardiology

## 2020-09-05 DIAGNOSIS — I4819 Other persistent atrial fibrillation: Secondary | ICD-10-CM

## 2020-09-05 DIAGNOSIS — Q231 Congenital insufficiency of aortic valve: Secondary | ICD-10-CM

## 2020-09-05 DIAGNOSIS — I712 Thoracic aortic aneurysm, without rupture: Secondary | ICD-10-CM

## 2020-10-14 ENCOUNTER — Other Ambulatory Visit: Payer: Self-pay

## 2020-10-14 ENCOUNTER — Emergency Department (HOSPITAL_COMMUNITY)
Admission: EM | Admit: 2020-10-14 | Discharge: 2020-10-14 | Disposition: A | Discharge status: Home / Self Care | Payer: Managed Care, Other (non HMO) | Attending: Emergency Medicine | Admitting: Emergency Medicine

## 2020-10-14 DIAGNOSIS — E039 Hypothyroidism, unspecified: Secondary | ICD-10-CM | POA: Insufficient documentation

## 2020-10-14 DIAGNOSIS — I10 Essential (primary) hypertension: Secondary | ICD-10-CM | POA: Insufficient documentation

## 2020-10-14 DIAGNOSIS — Z7901 Long term (current) use of anticoagulants: Secondary | ICD-10-CM | POA: Insufficient documentation

## 2020-10-14 DIAGNOSIS — F17210 Nicotine dependence, cigarettes, uncomplicated: Secondary | ICD-10-CM | POA: Insufficient documentation

## 2020-10-14 DIAGNOSIS — R002 Palpitations: Secondary | ICD-10-CM | POA: Diagnosis present

## 2020-10-14 DIAGNOSIS — E119 Type 2 diabetes mellitus without complications: Secondary | ICD-10-CM | POA: Insufficient documentation

## 2020-10-14 DIAGNOSIS — I4891 Unspecified atrial fibrillation: Secondary | ICD-10-CM | POA: Diagnosis not present

## 2020-10-14 LAB — CBC WITH DIFFERENTIAL/PLATELET
Abs Immature Granulocytes: 0.01 10*3/uL (ref 0.00–0.07)
Basophils Absolute: 0.1 10*3/uL (ref 0.0–0.1)
Basophils Relative: 1 %
Eosinophils Absolute: 0.3 10*3/uL (ref 0.0–0.5)
Eosinophils Relative: 5 %
HCT: 42.6 % (ref 39.0–52.0)
Hemoglobin: 14.4 g/dL (ref 13.0–17.0)
Immature Granulocytes: 0 %
Lymphocytes Relative: 21 %
Lymphs Abs: 1.4 10*3/uL (ref 0.7–4.0)
MCH: 31.2 pg (ref 26.0–34.0)
MCHC: 33.8 g/dL (ref 30.0–36.0)
MCV: 92.2 fL (ref 80.0–100.0)
Monocytes Absolute: 0.6 10*3/uL (ref 0.1–1.0)
Monocytes Relative: 8 %
Neutro Abs: 4.5 10*3/uL (ref 1.7–7.7)
Neutrophils Relative %: 65 %
Platelets: 182 10*3/uL (ref 150–400)
RBC: 4.62 MIL/uL (ref 4.22–5.81)
RDW: 12.8 % (ref 11.5–15.5)
WBC: 6.9 10*3/uL (ref 4.0–10.5)
nRBC: 0 % (ref 0.0–0.2)

## 2020-10-14 LAB — MAGNESIUM: Magnesium: 1.8 mg/dL (ref 1.7–2.4)

## 2020-10-14 LAB — BASIC METABOLIC PANEL
Anion gap: 4 — ABNORMAL LOW (ref 5–15)
BUN: 10 mg/dL (ref 6–20)
CO2: 28 mmol/L (ref 22–32)
Calcium: 8.5 mg/dL — ABNORMAL LOW (ref 8.9–10.3)
Chloride: 106 mmol/L (ref 98–111)
Creatinine, Ser: 0.94 mg/dL (ref 0.61–1.24)
GFR, Estimated: >60 mL/min (ref >60)
Glucose, Bld: 139 mg/dL — ABNORMAL HIGH (ref 70–99)
Potassium: 4.1 mmol/L (ref 3.5–5.1)
Sodium: 138 mmol/L (ref 135–145)

## 2020-10-14 NOTE — ED Triage Notes (Signed)
Patient states HR up into 150s today with heart fluttering patient states heart rate has now come down but would like to get checked out, get a shot and go home. Patient has hx of a-fib with DM. CBG 160 with dexacom.

## 2020-10-14 NOTE — ED Provider Notes (Signed)
Emergency Department Provider Note   I have reviewed the triage vital signs and the nursing notes.   HISTORY  Chief Complaint Irregular Heart Beat   HPI Jeffery Beard is a 60 y.o. male with past medical history reviewed below including paroxysmal atrial fibrillation presents to the emergency department with elevated heart rate.  Patient states it felt like he was in A. fib and recorded heart rates into the 150s.  He has been taking his anticoagulant as well as his Cardizem.  He states that his heart rate has improved significantly and at this time he is asymptomatic.  He never had chest pain.  He had some palpitations.  He had to call out of work and so initially thought about going to urgent care but was told they could only do an EKG there so he came in for evaluation here.  He denies prior history of cardioversion.  He is followed by the [facility] locally.  Currently asymptomatic.  No changes to his medications.  Past Medical History:  Diagnosis Date   Atrial fibrillation [facility])    Details unclear - patient states diagnosed in his 85s   Bicuspid aortic valve    Hyperlipidemia    Sarcoidosis    Stroke [facility])    Thoracic ascending aortic aneurysm (HCC)    Type 2 diabetes mellitus [facility])     Patient Active Problem List   Diagnosis Date Noted   Atrial fibrillation (HCC) 06/27/2019   LADA (latent autoimmune diabetes in adults), managed as type 1 (HCC) 09/06/2018   Hypothyroidism 09/06/2018   Bicuspid aortic valve    Nonrheumatic aortic valve stenosis    Thoracic aortic aneurysm without rupture (HCC)    Essential hypertension    Sarcoidosis    Type 2 diabetes mellitus (HCC)    Atrial fibrillation with RVR (HCC) 08/14/2018   Mixed hyperlipidemia 03/24/2018   Current smoker 03/24/2018    Past Surgical History:  Procedure Laterality Date   LUNG BIOPSY     Nasal septum surgery     PALATE / UVULA BIOPSY / EXCISION     TONSILLECTOMY     VASECTOMY       Allergies Codeine, Hydrocodone-acetaminophen, and Contrast media [iodinated diagnostic agents]  Family History  Problem Relation Age of Onset   COPD Mother    High blood pressure Mother    Cancer - Prostate Father    High blood pressure Father     Social History Social History   Tobacco Use   Smoking status: Every Day    Packs/day: 1.00    Years: 25.00    Pack years: 25.00    Types: Cigarettes   Smokeless tobacco: Never  Vaping Use   Vaping Use: Never used  Substance Use Topics   Alcohol use: Never   Drug use: Never    Review of Systems  Constitutional: No fever/chills Eyes: No visual changes. ENT: No sore throat. Cardiovascular: Denies chest pain. Positive palpitations.  Respiratory: Denies shortness of breath. Gastrointestinal: No abdominal pain.  No nausea, no vomiting.  No diarrhea.  No constipation. Genitourinary: Negative for dysuria. Musculoskeletal: Negative for back pain. Skin: Negative for rash. Neurological: Negative for headaches, focal weakness or numbness.  10-point ROS otherwise negative.  ____________________________________________   PHYSICAL EXAM:  VITAL SIGNS: ED Triage Vitals  Enc Vitals Group     BP 10/14/20 1021 (!) 143/94     Pulse Rate 10/14/20 1021 75     Resp 10/14/20 1021 18  Temp 10/14/20 1021 98.2 F (36.8 C)     Temp Source 10/14/20 1021 Oral     SpO2 10/14/20 1021 100 %     Weight 10/14/20 1022 175 lb (79.4 kg)     Height 10/14/20 1022 6' (1.829 m)   Constitutional: Alert and oriented. Well appearing and in no acute distress. Eyes: Conjunctivae are normal.  Head: Atraumatic. Nose: No congestion/rhinnorhea. Mouth/Throat: Mucous membranes are moist.  Neck: No stridor.   Cardiovascular: Normal rate, regular rhythm. Good peripheral circulation. Grossly normal heart sounds.   Respiratory: Normal respiratory effort.  No retractions. Lungs CTAB. Gastrointestinal: Soft and nontender. No distention.  Musculoskeletal:  No lower extremity tenderness nor edema. No gross deformities of extremities. Neurologic:  Normal speech and language. No gross focal neurologic deficits are appreciated.  Skin:  Skin is warm, dry and intact. No rash noted.  ____________________________________________   LABS (all labs ordered are listed, but only abnormal results are displayed)  Labs Reviewed  BASIC METABOLIC PANEL - Abnormal; Notable for the following components:      Result Value   Glucose, Bld 139 (*)    Calcium 8.5 (*)    Anion gap 4 (*)    All other components within normal limits  CBC WITH DIFFERENTIAL/PLATELET  MAGNESIUM   ____________________________________________  EKG   EKG Interpretation  Date/Time:  Monday October 14 2020 10:26:31 EDT Ventricular Rate:  78 PR Interval:    QRS Duration: 91 QT Interval:  383 QTC Calculation: 437 R Axis:   76 Text Interpretation: Atrial fibrillation Left ventricular hypertrophy Confirmed by Alona Bene [phone]) on 10/14/2020 10:30:30 AM         ____________________________________________   PROCEDURES  Procedure(s) performed:   Procedures  None  ____________________________________________   INITIAL IMPRESSION / ASSESSMENT AND PLAN / ED COURSE  Pertinent labs & imaging results that were available during my care of the patient were reviewed by me and considered in my medical decision making (see chart for details).   Patient presents to the emergency department with symptomatic atrial fibrillation.  Currently asymptomatic at the time of my evaluation.  His heart rate is in the 70s although does remain in A. fib.  No hypotension.  I do not see an indication for emergent cardioversion in the emergency department.  Patient compliant with his home medications including diltiazem.  He is followed by the [facility] team.  Plan for screening blood work to evaluate for possible electrolyte disturbance but ultimately patient should be okay for discharge and close  cardiology follow-up.  Labs reviewed. Patient without symptoms and rate controlled. Will continue meds and follow with Cardiology. Discussed ED return precautions.  ____________________________________________  FINAL CLINICAL IMPRESSION(S) / ED DIAGNOSES  Final diagnoses:  Atrial fibrillation, unspecified type [facility])     Note:  This document was prepared using Dragon voice recognition software and may include unintentional dictation errors.  Alona Bene, MD, [facility] Emergency Medicine    Stehanie Ekstrom, Arlyss Repress, MD 10/15/20 [phone]

## 2020-10-14 NOTE — Discharge Instructions (Addendum)
You were seen in the emergency department today with symptomatic atrial fibrillation.  Your heart rate has normalized and your lab work here is reassuring.  Please call your heart doctor today to confirm your follow-up appointment.  If you develop chest pain, return of symptoms, lightheadedness/passing out you should return to the emergency department.

## 2020-10-23 NOTE — Progress Notes (Signed)
Cardiology Office Note  Date: 10/24/2020   ID: Jeffery Beard, DOB 10/31/1960, MRN [id number]  PCP:  Benita Stabile, MD  Cardiologist:  Nona Dell, MD Electrophysiologist:  None   Chief Complaint  Patient presents with   Cardiac follow-up     History of Present Illness: Jeffery Beard is a 60 y.o. male last seen in December 2021.  He is here for a follow-up visit.  He was seen in the ER recently in July presenting with palpitations and noted to be in atrial fibrillation.  Rate was reasonably well controlled and he was not admitted.  Lab work and ECG are noted below.  He spontaneously converted to sinus, heart rate is regular today.  In terms of symptoms he has been more fatigued, intermittent episodes of chest tightness, question of increasing symptomatic atrial fibrillation versus ischemic heart disease discussed today.  Follow-up chest CTA in December 2021 revealed stable 46 mm fusiform ascending aortic aneurysm.  Also calcified bicuspid aortic valve and moderate coronary atherosclerosis.  I talked to him about follow-up surveillance testing including echocardiography and chest CTA, also the possibility of formal ischemic work-up.  He would like to hold off on testing for now due to financial concerns and his present insurance coverage.  We will revisit this within the next 6 months, sooner if his symptoms worsen.  I reviewed his medications which are stable and outlined below.  Past Medical History:  Diagnosis Date   Atrial fibrillation [facility])    Details unclear - patient states diagnosed in his 62s   Bicuspid aortic valve    Hyperlipidemia    Sarcoidosis    Stroke [facility])    Thoracic ascending aortic aneurysm (HCC)    Type 2 diabetes mellitus (HCC)     Past Surgical History:  Procedure Laterality Date   LUNG BIOPSY     Nasal septum surgery     PALATE / UVULA BIOPSY / EXCISION     TONSILLECTOMY     VASECTOMY      Current Outpatient Medications   Medication Sig Dispense Refill   acetaminophen (TYLENOL) 650 MG CR tablet Take 650 mg by mouth every 8 (eight) hours as needed for pain.     apixaban (ELIQUIS) 5 MG TABS tablet Take 1 tablet (5 mg total) by mouth 2 (two) times daily. 60 tablet 11   diltiazem (CARDIZEM CD) 180 MG 24 hr capsule Take 1 capsule (180 mg total) by mouth daily. 30 capsule 11   HUMALOG 100 UNIT/ML injection SMARTSIG:50-70 Unit(s) SUB-Q Daily     Insulin Human (INSULIN PUMP) SOLN Inject 50-70 each into the skin daily as needed.     levothyroxine (SYNTHROID) 50 MCG tablet Take 50 mcg by mouth daily before breakfast.     nitroGLYCERIN (NITROSTAT) 0.4 MG SL tablet Place 1 tablet (0.4 mg total) under the tongue every 5 (five) minutes as needed for chest pain. 25 tablet 3   traMADol (ULTRAM) 50 MG tablet Take by mouth every 6 (six) hours as needed.     albuterol (VENTOLIN HFA) 108 (90 Base) MCG/ACT inhaler Inhale 1-2 puffs into the lungs every 6 (six) hours as needed for wheezing or shortness of breath. (Patient not taking: Reported on 10/24/2020) 18 g 0   GVOKE HYPOPEN 2-PACK 1 MG/0.2ML SOAJ Inject 1 mg into the skin daily as needed. (Patient not taking: Reported on 10/24/2020)     methocarbamol (ROBAXIN) 750 MG tablet Take 750 mg by mouth 4 (four) times daily. (Patient not taking: Reported  on 10/24/2020)     No current facility-administered medications for this visit.   Allergies:  Codeine, Hydrocodone-acetaminophen, and Contrast media [iodinated diagnostic agents]   ROS: No syncope.  Intermittent headaches.  Physical Exam: VS:  BP 118/74   Pulse 70   Ht 6' (1.829 m)   Wt 175 lb 3.2 oz (79.5 kg)   SpO2 98%   BMI 23.76 kg/m , BMI Body mass index is 23.76 kg/m.  Wt Readings from Last 3 Encounters:  10/24/20 175 lb 3.2 oz (79.5 kg)  10/14/20 175 lb (79.4 kg)  03/16/20 170 lb (77.1 kg)    General: Patient appears comfortable at rest. HEENT: Conjunctiva and lids normal, wearing a mask. Neck: Supple, no elevated JVP  or carotid bruits, no thyromegaly. Lungs: Clear to auscultation, nonlabored breathing at rest. Cardiac: Regular rate and rhythm, no S3, 3/6 systolic murmur, no pericardial rub. Extremities: No pitting edema.  ECG:  An ECG dated 10/14/2020 was personally reviewed today and demonstrated:  Rate controlled atrial fibrillation with increased voltage and nonspecific ST changes.  Recent Labwork: 03/16/2020: ALT 13; AST 14 10/14/2020: BUN 10; Creatinine, Ser 0.94; Hemoglobin 14.4; Magnesium 1.8; Platelets 182; Potassium 4.1; Sodium 138   Other Studies Reviewed Today:  Echocardiogram 01/26/2020 [facility]): LVEF greater than 65%, normal RV contraction, bicuspid aortic valve with mild aortic regurgitation and mild aortic stenosis, ascending aortic dilatation at 45 mm.  Chest CTA 04/02/2020: IMPRESSION: Vascular:   1. Unchanged fusiform ascending aortic aneurysm measuring up to 46 mm. Recommend semi-annual imaging followup by CTA or MRA and referral to cardiothoracic surgery if not already obtained. This recommendation follows 2010 ACCF/AHA/AATS/ACR/ASA/SCA/SCAI/SIR/STS/SVM Guidelines for the Diagnosis and Management of Patients With Thoracic Aortic Disease. Circulation. 2010; 121: G626-R485. Aortic aneurysm NOS (ICD10-I71.9) 2. Calcifications about the bicuspid-appearing aortic valve. 3. Moderate coronary atherosclerosis.   Non-Vascular:   1. Unchanged nodular opacities in the right lobe upper lobe. 2. Unchanged bullous emphysematous changes.  Assessment and Plan:  1.  Bicuspid aortic valve with mild aortic stenosis and regurgitation by echocardiography in October 2021.  No change in cardiac murmur.  We have discussed considering follow-up echocardiography within the next 6 months to a year.  2.  Ascending thoracic aortic aneurysm, stable at 46 mm by chest CTA in December 2021.  He is due for follow-up surveillance imaging in the next 6 months.  He would like to hold off for now as  discussed above.  3.  Paroxysmal to persistent atrial fibrillation/flutter with CHA2DS2-VASc score of 4.  Remains on Eliquis for stroke prophylaxis, no bleeding problems reported.  I reviewed his recent lab work and ECG.  Heart rate is regular today.  Question whether any of his reported symptoms are related to increasing rhythm burden, although possibility of ischemic work-up also discussed.  We will revisit this at next visit, sooner if symptoms worsen.  Medication Adjustments/Labs and Tests Ordered: Current medicines are reviewed at length with the patient today.  Concerns regarding medicines are outlined above.   Tests Ordered: No orders of the defined types were placed in this encounter.   Medication Changes: No orders of the defined types were placed in this encounter.   Disposition:  Follow up  December.  Signed, Jonelle Sidle, MD, [facility] 10/24/2020 9:08 AM    [facility] at [facility] [address] [place], [place] [address] Phone: [phone]; Fax: [phone]

## 2020-10-24 ENCOUNTER — Ambulatory Visit (INDEPENDENT_AMBULATORY_CARE_PROVIDER_SITE_OTHER): Payer: Managed Care, Other (non HMO) | Admitting: Cardiology

## 2020-10-24 ENCOUNTER — Other Ambulatory Visit: Payer: Self-pay

## 2020-10-24 ENCOUNTER — Encounter: Payer: Self-pay | Admitting: Cardiology

## 2020-10-24 VITALS — BP 118/74 | HR 70 | Ht 72.0 in | Wt 175.2 lb

## 2020-10-24 DIAGNOSIS — I712 Thoracic aortic aneurysm, without rupture, unspecified: Secondary | ICD-10-CM

## 2020-10-24 DIAGNOSIS — I4819 Other persistent atrial fibrillation: Secondary | ICD-10-CM

## 2020-10-24 DIAGNOSIS — I350 Nonrheumatic aortic (valve) stenosis: Secondary | ICD-10-CM

## 2020-10-24 NOTE — Patient Instructions (Signed)
Medication Instructions:  Your physician recommends that you continue on your current medications as directed. Please refer to the Current Medication list given to you today.  *If you need a refill on your cardiac medications before your next appointment, please call your pharmacy*   Lab Work: None today  If you have labs (blood work) drawn today and your tests are completely normal, you will receive your results only by: MyChart Message (if you have MyChart) OR A paper copy in the mail If you have any lab test that is abnormal or we need to change your treatment, we will call you to review the results.   Testing/Procedures: None today    Follow-Up: At [facility], you and your health needs are our priority.  As part of our continuing mission to provide you with exceptional heart care, we have created designated Provider Care Teams.  These Care Teams include your primary Cardiologist (physician) and Advanced Practice Providers (APPs -  Physician Assistants and Nurse Practitioners) who all work together to provide you with the care you need, when you need it.  We recommend signing up for the patient portal called "MyChart".  Sign up information is provided on this After Visit Summary.  MyChart is used to connect with patients for Virtual Visits (Telemedicine).  Patients are able to view lab/test results, encounter notes, upcoming appointments, etc.  Non-urgent messages can be sent to your provider as well.   To learn more about what you can do with MyChart, go to [email or web address].    Your next appointment:   5 month(s)  The format for your next appointment:   In Person  Provider:   Nona Dell, MD   Other Instructions None

## 2020-12-03 ENCOUNTER — Other Ambulatory Visit: Payer: Self-pay | Admitting: "Endocrinology

## 2021-03-25 NOTE — Progress Notes (Deleted)
Cardiology Office Note  Date: 03/25/2021   ID: Jeffery Beard, DOB 1960/11/24, MRN [id number]  PCP:  Benita Stabile, MD  Cardiologist:  Nona Dell, MD Electrophysiologist:  None   No chief complaint on file.   History of Present Illness: Jeffery Beard is a 60 y.o. male last seen in July.  We discussed follow-up surveillance chest CTA and echocardiogram.  Past Medical History:  Diagnosis Date   Atrial fibrillation [facility])    Details unclear - patient states diagnosed in his 40s   Bicuspid aortic valve    Hyperlipidemia    Sarcoidosis    Stroke [facility])    Thoracic ascending aortic aneurysm (HCC)    Type 2 diabetes mellitus (HCC)     Past Surgical History:  Procedure Laterality Date   LUNG BIOPSY     Nasal septum surgery     PALATE / UVULA BIOPSY / EXCISION     TONSILLECTOMY     VASECTOMY      Current Outpatient Medications  Medication Sig Dispense Refill   acetaminophen (TYLENOL) 650 MG CR tablet Take 650 mg by mouth every 8 (eight) hours as needed for pain.     albuterol (VENTOLIN HFA) 108 (90 Base) MCG/ACT inhaler Inhale 1-2 puffs into the lungs every 6 (six) hours as needed for wheezing or shortness of breath. (Patient not taking: Reported on 10/24/2020) 18 g 0   apixaban (ELIQUIS) 5 MG TABS tablet Take 1 tablet (5 mg total) by mouth 2 (two) times daily. 60 tablet 11   diltiazem (CARDIZEM CD) 180 MG 24 hr capsule Take 1 capsule (180 mg total) by mouth daily. 30 capsule 11   GVOKE HYPOPEN 2-PACK 1 MG/0.2ML SOAJ Inject 1 mg into the skin daily as needed. (Patient not taking: Reported on 10/24/2020)     HUMALOG 100 UNIT/ML injection SMARTSIG:50-70 Unit(s) SUB-Q Daily     Insulin Human (INSULIN PUMP) SOLN Inject 50-70 each into the skin daily as needed.     levothyroxine (SYNTHROID) 50 MCG tablet Take 50 mcg by mouth daily before breakfast.     methocarbamol (ROBAXIN) 750 MG tablet Take 750 mg by mouth 4 (four) times daily. (Patient not taking: Reported on  10/24/2020)     nitroGLYCERIN (NITROSTAT) 0.4 MG SL tablet Place 1 tablet (0.4 mg total) under the tongue every 5 (five) minutes as needed for chest pain. 25 tablet 3   traMADol (ULTRAM) 50 MG tablet Take by mouth every 6 (six) hours as needed.     No current facility-administered medications for this visit.   Allergies:  Codeine, Hydrocodone-acetaminophen, and Contrast media [iodinated diagnostic agents]   Social History: The patient  reports that he has been smoking cigarettes. He has a 25.00 pack-year smoking history. He has never used smokeless tobacco. He reports that he does not drink alcohol and does not use drugs.   Family History: The patient's family history includes COPD in his mother; Cancer - Prostate in his father; High blood pressure in his father and mother.   ROS:  Please see the history of present illness. Otherwise, complete review of systems is positive for {NONE DEFAULTED:18576}.  All other systems are reviewed and negative.   Physical Exam: VS:  There were no vitals taken for this visit., BMI There is no height or weight on file to calculate BMI.  Wt Readings from Last 3 Encounters:  10/24/20 175 lb 3.2 oz (79.5 kg)  10/14/20 175 lb (79.4 kg)  03/16/20 170 lb (77.1 kg)  General: Patient appears comfortable at rest. HEENT: Conjunctiva and lids normal, oropharynx clear with moist mucosa. Neck: Supple, no elevated JVP or carotid bruits, no thyromegaly. Lungs: Clear to auscultation, nonlabored breathing at rest. Cardiac: Regular rate and rhythm, no S3 or significant systolic murmur, no pericardial rub. Abdomen: Soft, nontender, no hepatomegaly, bowel sounds present, no guarding or rebound. Extremities: No pitting edema, distal pulses 2+. Skin: Warm and dry. Musculoskeletal: No kyphosis. Neuropsychiatric: Alert and oriented x3, affect grossly appropriate.  ECG:  An ECG dated 10/14/2020 was personally reviewed today and demonstrated:  Atrial fibrillation with  increased voltage and nonspecific ST changes.  Recent Labwork: 10/14/2020: BUN 10; Creatinine, Ser 0.94; Hemoglobin 14.4; Magnesium 1.8; Platelets 182; Potassium 4.1; Sodium 138     Component Value Date/Time   CHOL 141 01/22/2018 0000   TRIG 89 01/22/2018 0000   HDL 49 01/22/2018 0000   LDLCALC 79 01/22/2018 0000    Other Studies Reviewed Today:  Echocardiogram 01/26/2020 [facility]): LVEF greater than 65%, normal RV contraction, bicuspid aortic valve with mild aortic regurgitation and mild aortic stenosis, ascending aortic dilatation at 45 mm.   Chest CTA 04/02/2020: IMPRESSION: Vascular:   1. Unchanged fusiform ascending aortic aneurysm measuring up to 46 mm. Recommend semi-annual imaging followup by CTA or MRA and referral to cardiothoracic surgery if not already obtained. This recommendation follows 2010 ACCF/AHA/AATS/ACR/ASA/SCA/SCAI/SIR/STS/SVM Guidelines for the Diagnosis and Management of Patients With Thoracic Aortic Disease. Circulation. 2010; 121ML:4928372. Aortic aneurysm NOS (ICD10-I71.9) 2. Calcifications about the bicuspid-appearing aortic valve. 3. Moderate coronary atherosclerosis.   Non-Vascular:   1. Unchanged nodular opacities in the right lobe upper lobe. 2. Unchanged bullous emphysematous changes.  Assessment and Plan:    Medication Adjustments/Labs and Tests Ordered: Current medicines are reviewed at length with the patient today.  Concerns regarding medicines are outlined above.   Tests Ordered: No orders of the defined types were placed in this encounter.   Medication Changes: No orders of the defined types were placed in this encounter.   Disposition:  Follow up {follow up:15908}  Signed, Satira Sark, MD, [facility] 03/25/2021 12:58 PM    [facility] [place], [place], [place] [address] Phone: [phone]; Fax: [phone]

## 2021-03-26 ENCOUNTER — Ambulatory Visit: Payer: Managed Care, Other (non HMO) | Admitting: Cardiology

## 2022-02-09 ENCOUNTER — Other Ambulatory Visit (HOSPITAL_COMMUNITY): Payer: Self-pay | Admitting: Internal Medicine

## 2022-02-09 DIAGNOSIS — R109 Unspecified abdominal pain: Secondary | ICD-10-CM

## 2022-02-14 ENCOUNTER — Ambulatory Visit (HOSPITAL_BASED_OUTPATIENT_CLINIC_OR_DEPARTMENT_OTHER): Payer: Managed Care, Other (non HMO)

## 2022-02-14 ENCOUNTER — Encounter (HOSPITAL_BASED_OUTPATIENT_CLINIC_OR_DEPARTMENT_OTHER): Payer: Self-pay

## 2022-03-09 ENCOUNTER — Encounter: Payer: Self-pay | Admitting: Internal Medicine

## 2022-03-20 IMAGING — CT CT HEAD W/O CM
3 series · 16 of 47 positions shown, 19 images · non-contrast
Comparison: None available

CLINICAL DATA: TIA symptoms, right paresthesias

EXAM:
CT HEAD WITHOUT CONTRAST
TECHNIQUE: Contiguous axial images were obtained from the base of the skull
through the vertex without intravenous contrast.

[Series 2: head w o · axial · 0.45mm/px · z∈[+27,+157]mm · 10 of 32 slices shown, 13 images]
[im 3/32  brain]
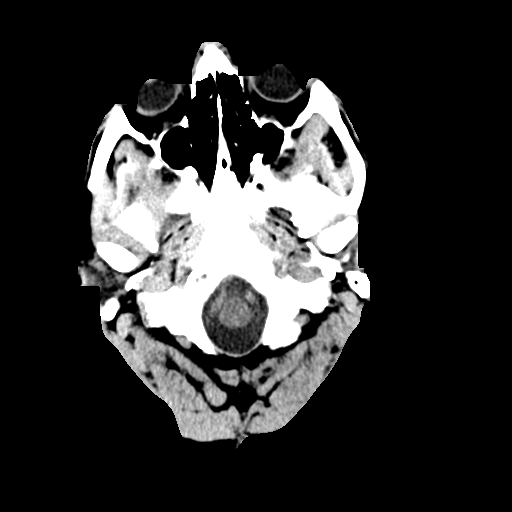
[im 3/32  bone]
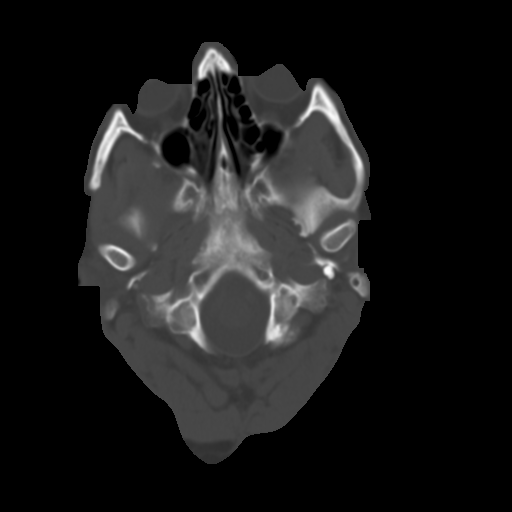
[im 6/32  brain]
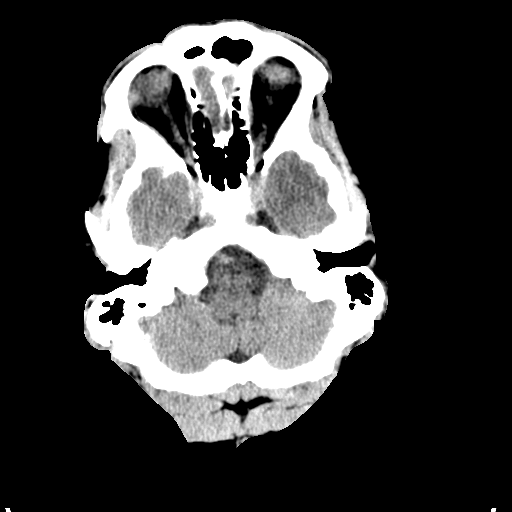
[im 9/32  brain]
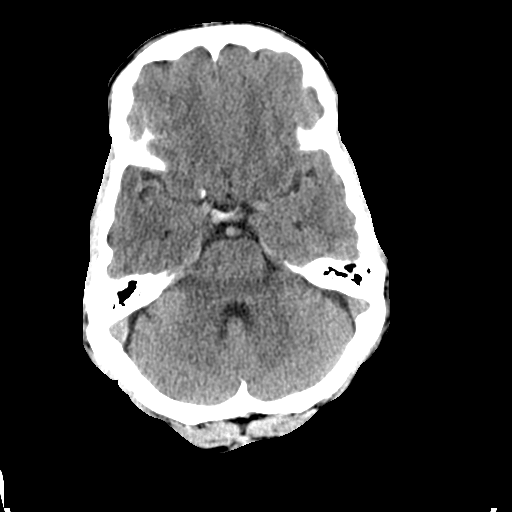
[im 11/32  brain]
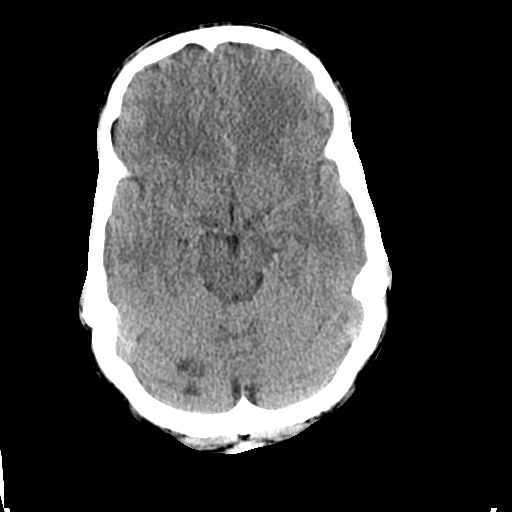
[im 14/32  brain]
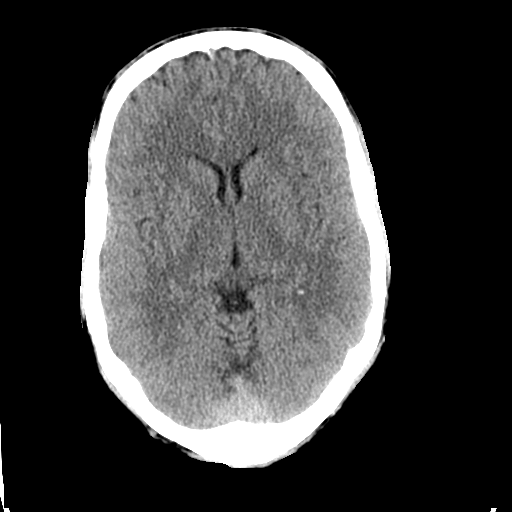
[im 14/32  bone]
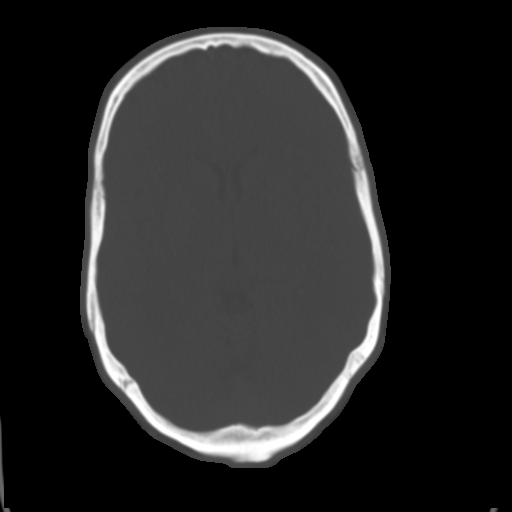
[im 18/32  brain]
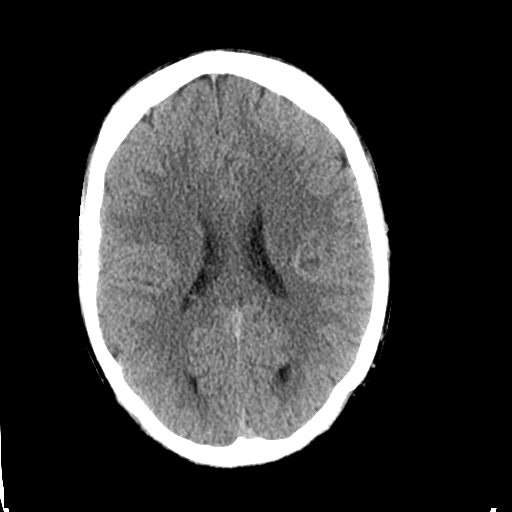
[im 21/32  brain]
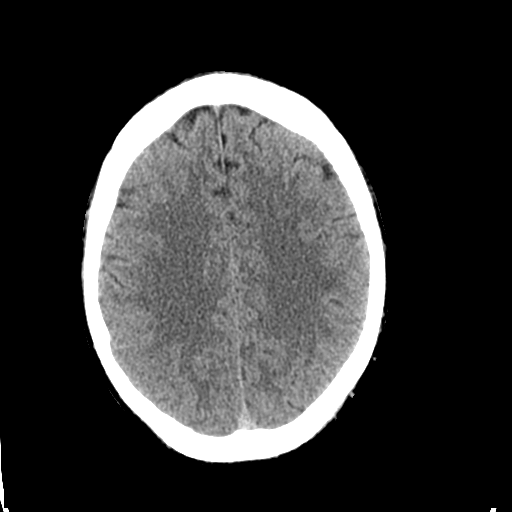
[im 24/32  brain]
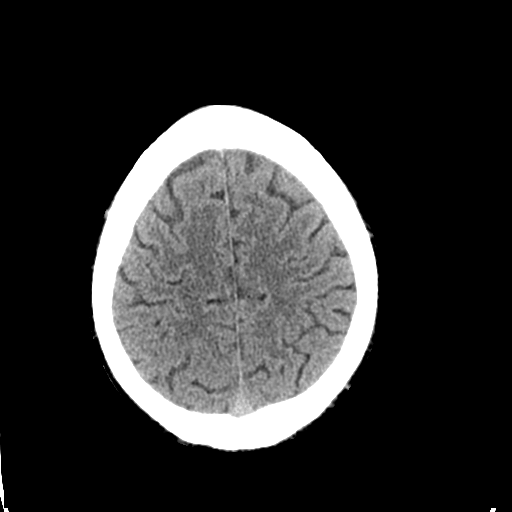
[im 26/32  brain]
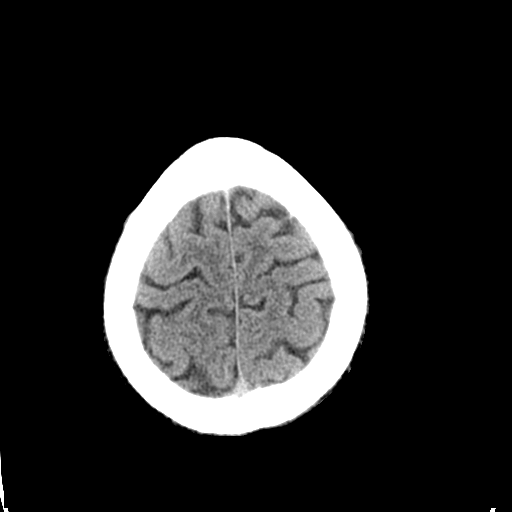
[im 26/32  bone]
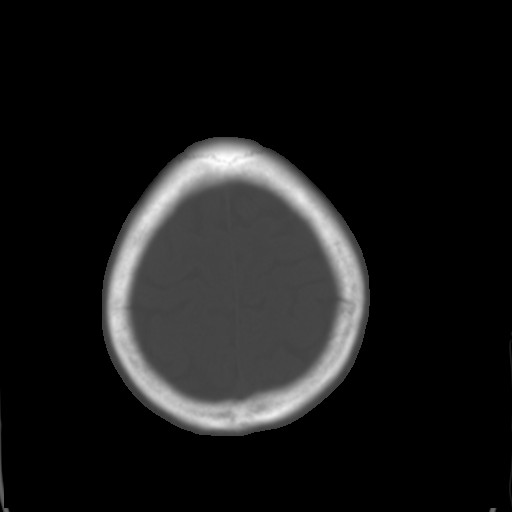
[im 29/32  brain]
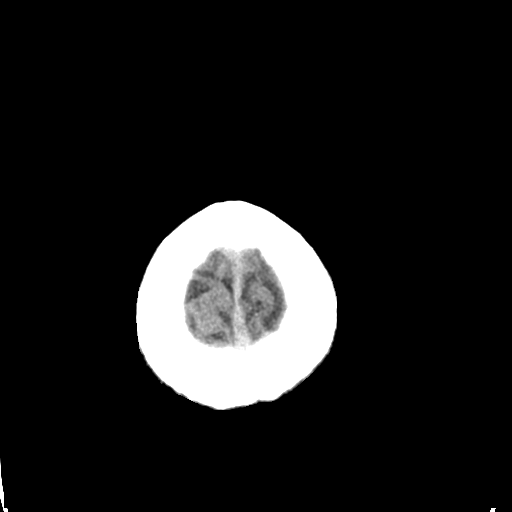

[Series 4: coronal soft · coronal · 0.33mm/px · 3 of 70 slices shown]
[im 24/70  brain]
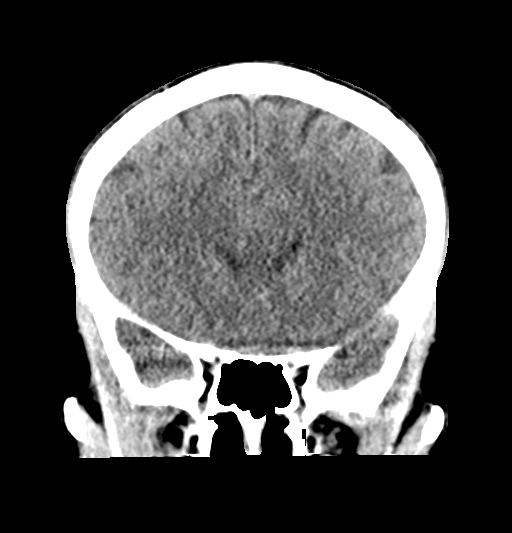
[im 31/70  brain]
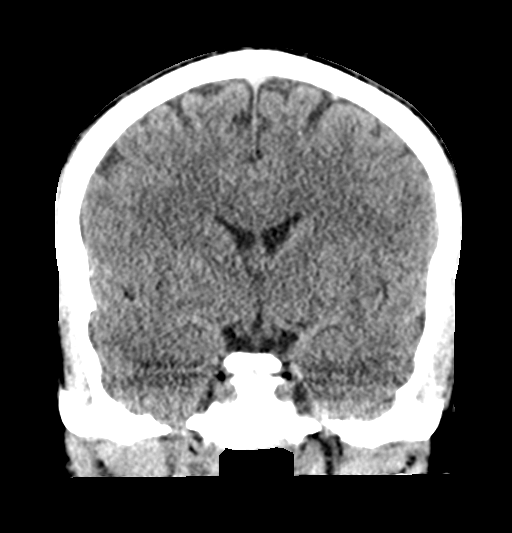
[im 39/70  brain]
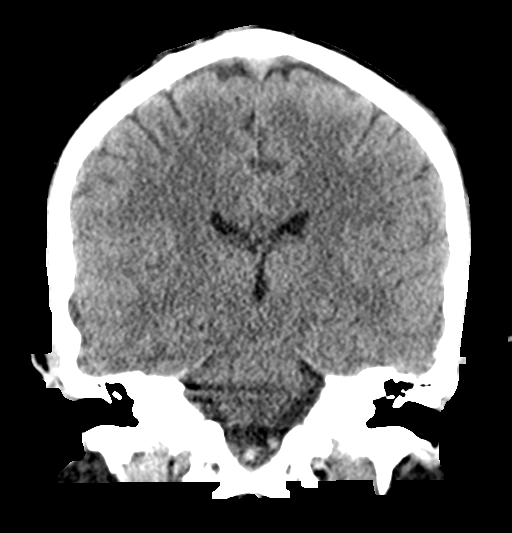

[Series 5: sagittal soft · sagittal · 0.35mm/px · 3 of 56 slices shown]
[im 19/56  brain]
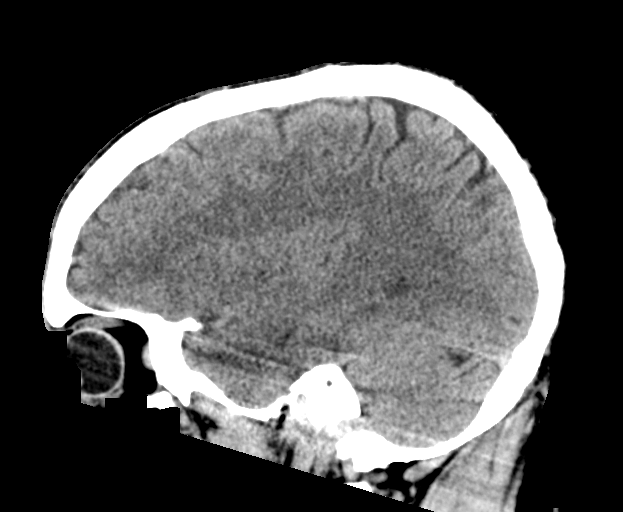
[im 28/56  brain]
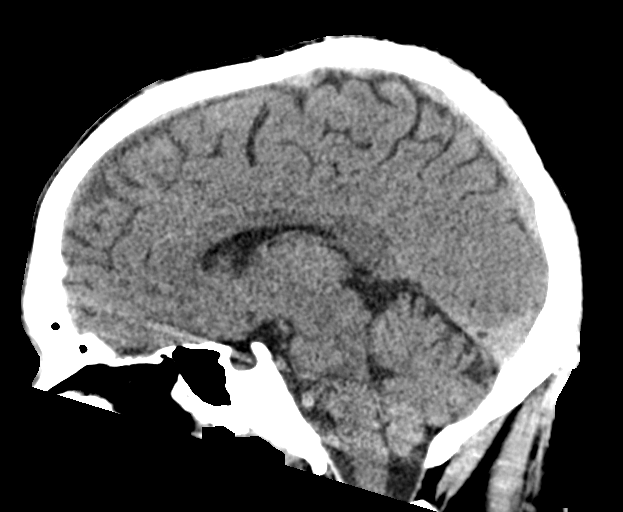
[im 37/56  brain]
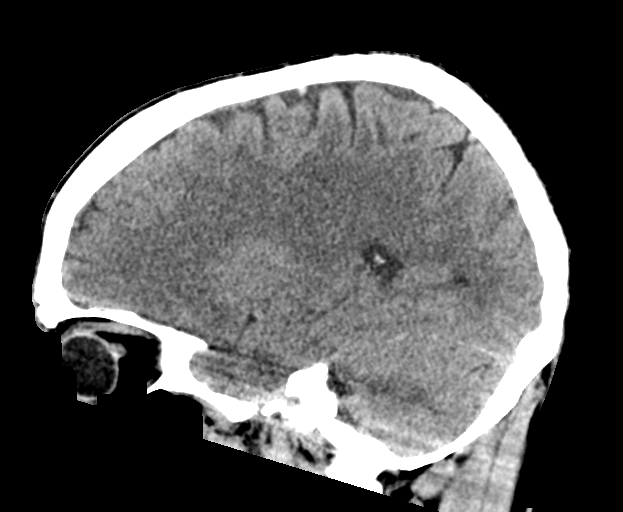

[16 of 47 positions shown; findings below may reference images not displayed]

FINDINGS: Brain: Minimal areas of subcortical white matter patchy hypodensity,
favored to be chronic microvascular ischemic change. No acute
intracranial hemorrhage, definite acute infarction, mass lesion,
herniation, hydrocephalus, or extra-axial fluid collection. Cisterns
are patent. Superior right cerebellar focal
hypodensity/encephalomalacia compatible with remote infarct.

Vascular: No hyperdense vessel or unexpected calcification.

Skull: Normal. Negative for fracture or focal lesion.

Sinuses/Orbits: No acute finding.

Other: None.
IMPRESSION: No acute intracranial abnormality by noncontrast CT. Minimal white
matter subcortical microvascular ischemic changes.

Remote right superior cerebellar infarct with focal
encephalomalacia.

## 2022-04-16 ENCOUNTER — Encounter: Payer: Self-pay | Admitting: Gastroenterology

## 2022-04-16 ENCOUNTER — Ambulatory Visit: Payer: Managed Care, Other (non HMO) | Admitting: Gastroenterology

## 2022-04-16 ENCOUNTER — Telehealth: Payer: Self-pay | Admitting: Gastroenterology

## 2022-04-16 VITALS — BP 137/91 | HR 73 | Temp 98.0°F | Ht 72.0 in | Wt 167.2 lb

## 2022-04-16 DIAGNOSIS — R195 Other fecal abnormalities: Secondary | ICD-10-CM | POA: Diagnosis not present

## 2022-04-16 DIAGNOSIS — R194 Change in bowel habit: Secondary | ICD-10-CM | POA: Diagnosis not present

## 2022-04-16 DIAGNOSIS — R140 Abdominal distension (gaseous): Secondary | ICD-10-CM | POA: Diagnosis not present

## 2022-04-16 DIAGNOSIS — R1084 Generalized abdominal pain: Secondary | ICD-10-CM | POA: Diagnosis not present

## 2022-04-16 NOTE — Telephone Encounter (Signed)
KUB ordered with office visit today.  Please reach out to the patient to schedule as able.  Venetia Night, MSN, APRN, FNP-BC, AGACNP-BC [facility]

## 2022-04-16 NOTE — Progress Notes (Signed)
GI Office Note    Referring Provider: Benita Stabile, MD Primary Care Physician:  Benita Stabile, MD  Primary Gastroenterologist: Hennie Duos. Marletta Lor, DO  Chief Complaint   Chief Complaint  Patient presents with   Diarrhea    Pt states that it has been going on around Aug 25th. States it started around the time he started Losartan. Goes 3 to 5 times daily.     History of Present Illness   Jeffery Beard is a 62 y.o. male presenting today at the request of Benita Stabile, MD for loose stools.    History of intermittent A-fib with ED evaluation in July 2022 with spontaneous resolution to normal sinus rhythm.  Noted to have a history of stable ascending aortic aneurysm and moderate coronary atherosclerosis.  Per referral from PCP patient reports he feels like he has to go most the time.  Stools anywhere between light tan to dark brown.  Try Linzess to help with IBS, stop taking in November.  Unable to do CT scan due to many issues.  Thinks all symptoms started with losartan was began.  Stop taking a few days prior to PCP visit on 03/07/2022.  GI referral placed.   Per chart review appears patient has a CT A/P ordered by PCP which has not been scheduled.  Labs 03/04/2022: A1c 8.5, lipid panel within normal limits, BUN 7, normal LFTs and renal function.  CBC normal with hemoglobin 14.1.  TSH 4.73 (slightly elevated) with normal T4.  Today:  Diarrhea: Began the end of August and has had 4-5 watery stools daily on average. Thought initially secondary to losartan and Linzess but has continued since stopping those medications. [place] 6/7 stools. Has pain with gas as well. Feels like moles underground with loud noises. Thinks is sounds like water or air moving around. Once or twice it was very firm but usually very watery. Has had a couple nocturnal stools with some mild incontinence. Does not usually relate to meals. Can be anytime. Can usually go once at work or twice during the day but usually  occurs before work and after he is home. Generally no significant urgency. No incontinence while awake. No weight loss. No melena or brbpr. Has good appetite. No family history of IBD. Pain mostly related to gas. Has had rare instance of random areas of sensitivity.   Used to have 1-2 stools per week to 4-5 per day and happened all of a sudden.   Last colonoscopy: At least 10 years ago. Was done in [place]  Reports no insurance and unable to afford colonoscopy. Has tried pepto bismol and milk of magnesia. Has been basing his treatment off of premise that he had constipation. Tried an enema at home a few months ago and after 1/3 of the bag emptied it stopped and had to pressure it in and then had to go quick. Only water came out after that.    Current Outpatient Medications  Medication Sig Dispense Refill   acetaminophen (TYLENOL) 650 MG CR tablet Take 650 mg by mouth every 8 (eight) hours as needed for pain.     apixaban (ELIQUIS) 5 MG TABS tablet Take 1 tablet (5 mg total) by mouth 2 (two) times daily. 60 tablet 11   Continuous Blood Gluc Sensor (DEXCOM G6 SENSOR) MISC SMARTSIG:Topical Every 10 Days     Continuous Blood Gluc Transmit (DEXCOM G6 TRANSMITTER) MISC USE AS DIRECTED EVERY 3 MONTHS     diltiazem (CARDIZEM CD) 180  MG 24 hr capsule Take 1 capsule (180 mg total) by mouth daily. 30 capsule 11   Insulin Disposable Pump (OMNIPOD 5 G6 POD, GEN 5,) MISC SMARTSIG:SUB-Q Every Other Day     levothyroxine (SYNTHROID) 50 MCG tablet Take 50 mcg by mouth daily before breakfast.     No current facility-administered medications for this visit.    Past Medical History:  Diagnosis Date   Atrial fibrillation [facility])    Details unclear - patient states diagnosed in his 16s   Bicuspid aortic valve    Hyperlipidemia    Sarcoidosis    Stroke [facility])    Thoracic ascending aortic aneurysm (HCC)    Type 2 diabetes mellitus ([place])     Past Surgical History:  Procedure Laterality Date   LUNG  BIOPSY     Nasal septum surgery     PALATE / UVULA BIOPSY / EXCISION     TONSILLECTOMY     VASECTOMY      Family History  Problem Relation Age of Onset   COPD Mother    High blood pressure Mother    Cancer - Prostate Father    High blood pressure Father     Allergies as of 04/16/2022 - Review Complete 04/16/2022  Allergen Reaction Noted   Codeine  03/23/2018   Hydrocodone-acetaminophen  02/02/2018   Contrast media [iodinated contrast media] Itching and Rash 04/11/2020    Social History   Socioeconomic History   Marital status: Married    Spouse name: Not on file   Number of children: Not on file   Years of education: Not on file   Highest education level: Not on file  Occupational History   Not on file  Tobacco Use   Smoking status: Every Day    Packs/day: 1.00    Years: 25.00    Total pack years: 25.00    Types: Cigarettes   Smokeless tobacco: Never  Vaping Use   Vaping Use: Never used  Substance and Sexual Activity   Alcohol use: Never   Drug use: Never   Sexual activity: Yes  Other Topics Concern   Not on file  Social History Narrative   Not on file   Social Determinants of Health   Financial Resource Strain: Not on file  Food Insecurity: Not on file  Transportation Needs: Not on file  Physical Activity: Not on file  Stress: Not on file  Social Connections: Not on file  Intimate Partner Violence: Not on file     Review of Systems   Gen: Denies any fever, chills, fatigue, weight loss, lack of appetite.  CV: Denies chest pain, heart palpitations, peripheral edema, syncope.  Resp: Denies shortness of breath at rest or with exertion. Denies wheezing or cough.  GI: see HPI GU : Denies urinary burning, urinary frequency, urinary hesitancy MS: Denies joint pain, muscle weakness, cramps, or limitation of movement.  Derm: Denies rash, itching, dry skin Psych: Denies depression, anxiety, memory loss, and confusion Heme: Denies bruising, bleeding, and  enlarged lymph nodes.   Physical Exam   There were no vitals taken for this visit.  General:   Alert and oriented. Pleasant and cooperative. Well-nourished and well-developed.  Head:  Normocephalic and atraumatic. Eyes:  Without icterus, sclera clear and conjunctiva pink.  Ears:  Normal auditory acuity. Mouth:  No deformity or lesions, oral mucosa pink.  Lungs:  Clear to auscultation bilaterally. No wheezes, rales, or rhonchi. No distress.  Heart:  S1, S2 present without murmurs appreciated.  Abdomen:  +  BS, soft, non-tender and non-distended. No HSM noted. No guarding or rebound. No masses appreciated.  Rectal:  Deferred Msk:  Symmetrical without gross deformities. Normal posture. Extremities:  Without edema. Neurologic:  Alert and  oriented x4;  grossly normal neurologically. Skin:  Intact without significant lesions or rashes. Psych:  Alert and cooperative. Normal mood and affect.   Assessment   Jeffery Beard is a 62 y.o. male with a history of coronary artery disease, A-fib, HLD, stroke on Eliquis, thoracic ascending aortic aneurysm, diabetes on insulin pump, sarcoidosis presenting today for evaluation of loose stools.  Loose stools, gassiness, abdominal pain, change in bowel habits: Began the end of August and has had 4-5 watery stools daily on average. Thought initially secondary to losartan and Linzess but has continued since stopping those medications and symptoms have continued.  History of baseline constipation with 1-2 bowel movements per week and has had a significant change in bowel habits since August.  Tried Pepto-Bismol, milk of magnesia, and enema as he previously thought he may have had a bowel blockage/impaction of stool.  Even after enema he only had clear liquid come out.  Given onset of diarrhea greater than 69 years old and his change in bowel habits, there is a higher concern for malignancy.  His last colonoscopy was greater than 10 years ago and due to financial  constraints he has concerns about completing a colonoscopy at this time.  I strongly encouraged him to consider completion however he would like to perform basic blood work given it can be covered by his job and to trial some medications first.  He needs assessment for malignancy as well as biopsies rule out microscopic colitis, and IBD.  He was open to perform a KUB to see if there is any large stool burden to rule out constipation with overflow diarrhea.  Will check CRP, fecal elastase, celiac serologies to assess for any other causes of diarrhea.  After performing KUB I recommended that he may begin Imodium once to twice daily as needed based on bowel frequency.  Advised he may start with 2 mg dosing in the morning with an additional 1 mg in the afternoon and titrating up to 2 mg twice daily if needed.  He was instructed that if he felt like he needed 6 mg or more of Imodium daily that he should contact the office prior to his follow-up.  Only has pain related to gas therefore I recommended as needed Gas-X.  He very well could have an IBS component to his symptoms however he has had a couple of nocturnal stools over the prior 6 months.  His change in bowel habits is the most concerning factor currently however pancreatic insufficiency remains highly differential list given his longstanding history of uncontrolled diabetes despite interestingly patient does not express relationship to meals.  Denies any recent fever or chills.  Hypertension: The patient was found to have elevated blood pressure when vital signs were checked in the office. The blood pressure was rechecked by the nursing staff and it was found be persistently elevated >140/90 mmHg. I personally advised to the patient to follow up closely with PCP for hypertension control.  PLAN   KUB CRP, fecal elastase, celiac serologies ([facility]).  Imodium once to twice daily as needed. 2 mg first dose and may take additional dose if needed.  Gas ex or  Beano as needed.  Encouraged colonoscopy, patient would like to hold off due to financial constraints. Follow-up with PCP regarding elevated blood  pressure. Follow up in 4-6 weeks. Can be virtual if needed.    Brooke Bonito, MSN, FNP-BC, AGACNP-BC [facility]

## 2022-04-16 NOTE — Patient Instructions (Addendum)
I am ordering labs for you to completed at [facility] at your convenience.  Please state lab slips with the and return stool sample with urine given supplies.  As we discussed, given your history of diabetes, your watery stools could be secondary to something called pancreatic insufficiency.  This is what we are testing your stool for.  Are also going to rule out any inflammatory cause as well as celiac (gluten allergy, sensitivity).   Also as we discussed I would recommend a colonoscopy.  I understand your reservations surrounding the financial aspect of having it completed however given your change in bowel habits as well as your age it is recommended to rule out malignancy as cause of your symptoms.  We will order a abdominal x-ray to make sure we are not dealing with overflow diarrhea from baseline constipation.  You may take Imodium 2 mg (1 tablet) daily and monitor for frequency of stools.  If you continue to have more than 3 loose stools per day you may take an additional half tablet in the afternoon.  If after couple days he continues to have frequent stools he may take 2 mg in the morning and 2 mg in the afternoon.  Titrate as needed.  If you need to use 6 mg total of Imodium a day please let me know.  For gassiness you may take Gas-X or Beano, follow the directions on the bottle.  Another good option is something called Phazyme.  This is also over-the-counter (if you get the 250 mg dose may take up to 2/day).   Will plan to follow-up in 4 to 6 weeks, or sooner if needed.  If that is easier for you, we do have virtual options which we can complete if needed.  It was a pleasure to see you today. I want to create trusting relationships with patients. If you receive a survey regarding your visit,  I greatly appreciate you taking time to fill this out on paper or through your MyChart. I value your feedback.  Venetia Night, MSN, FNP-BC, AGACNP-BC [facility]

## 2022-04-17 NOTE — Telephone Encounter (Signed)
LMTRC

## 2022-05-14 ENCOUNTER — Encounter: Payer: Self-pay | Admitting: *Deleted

## 2022-05-14 NOTE — Telephone Encounter (Signed)
Left detailed message that provider wanted him to have a x-ray at [facility] and he could go at his earliest convenience. Any questions to call back.

## 2022-05-28 ENCOUNTER — Ambulatory Visit: Payer: Managed Care, Other (non HMO) | Admitting: Gastroenterology

## 2022-06-04 ENCOUNTER — Encounter: Payer: Self-pay | Admitting: Radiology

## 2022-06-24 NOTE — Progress Notes (Deleted)
GI Office Note    Referring Provider: Celene Squibb, MD Primary Care Physician:  Celene Squibb, MD Primary Gastroenterologist: Elon Alas. Abbey Chatters, DO  Date:  06/24/2022  ID:  Jeffery Beard, DOB [birth year], MRN [id number]   Chief Complaint   No chief complaint on file.   History of Present Illness  Jeffery Beard is a 62 y.o. male with a history of CAD, A-fib and stroke on Eliquis, HLD, thoracic ascending aortic aneurysm, diabetes on insulin pump, sarcoidosis presenting today for follow-up of loose stools and change in bowel habits.  Most recent lab work in November 2023: TSH 4.73 with normal T4.  Normal CBC.  BUN 7 otherwise unremarkable CMP.  Lipid panel wnl.  A1c 8.5  Seen for initial consultation 04/16/2022 for chronic loose stools.  On average has 4-5 watery stools daily.  He did not use previously prescribed Linzess and was also on losartan however he has stopped his medications and continues to have diarrhea.  Also with gas pains and mild audible sounds from his abdomen.  Noted a couple of nocturnal stools with some incontinence.  His episodes typically occur before or after he is home without significant urgency.  Abdominal pain appears to be mostly related to gas.  Denies any family history of IBD.  He reports prior to August of last year he would have 1-2 stools per week and now he is having 4-5/day.  Advised to get KUB to determine if any large stool burden.  Order CRP, fecal elastase, celiac serologies.  If ongoing diarrhea then you may use Imodium once to twice daily as needed.  Advised to use Gas-X or Beano as needed.  Encourage colonoscopy however patient had concerns about completing this due to financial reasons.  KUB and requested lab work have not been performed.  Today:     Current Outpatient Medications  Medication Sig Dispense Refill   acetaminophen (TYLENOL) 650 MG CR tablet Take 650 mg by mouth every 8 (eight) hours as needed for pain.     apixaban (ELIQUIS)  5 MG TABS tablet Take 1 tablet (5 mg total) by mouth 2 (two) times daily. 60 tablet 11   Continuous Blood Gluc Sensor (DEXCOM G6 SENSOR) MISC SMARTSIG:Topical Every 10 Days     Continuous Blood Gluc Transmit (DEXCOM G6 TRANSMITTER) MISC USE AS DIRECTED EVERY 3 MONTHS     diltiazem (CARDIZEM CD) 180 MG 24 hr capsule Take 1 capsule (180 mg total) by mouth daily. 30 capsule 11   Insulin Disposable Pump (OMNIPOD 5 G6 POD, GEN 5,) MISC SMARTSIG:SUB-Q Every Other Day     levothyroxine (SYNTHROID) 50 MCG tablet Take 50 mcg by mouth daily before breakfast.     No current facility-administered medications for this visit.    Past Medical History:  Diagnosis Date   Atrial fibrillation [facility])    Details unclear - patient states diagnosed in his 60s   Bicuspid aortic valve    Hyperlipidemia    Sarcoidosis    Stroke [facility])    Thoracic ascending aortic aneurysm ([facility])    Type 2 diabetes mellitus ([place])     Past Surgical History:  Procedure Laterality Date   LUNG BIOPSY     Nasal septum surgery     PALATE / UVULA BIOPSY / EXCISION     TONSILLECTOMY     VASECTOMY      Family History  Problem Relation Age of Onset   COPD Mother    High blood pressure Mother  Skin cancer Mother    Cancer - Prostate Father    High blood pressure Father    Cancer - Colon Neg Hx    Colon polyps Neg Hx     Allergies as of 06/25/2022 - Review Complete 04/16/2022  Allergen Reaction Noted   Codeine  03/23/2018   Hydrocodone-acetaminophen  02/02/2018   Contrast media [iodinated contrast media] Itching and Rash 04/11/2020    Social History   Socioeconomic History   Marital status: Married    Spouse name: Not on file   Number of children: Not on file   Years of education: Not on file   Highest education level: Not on file  Occupational History   Not on file  Tobacco Use   Smoking status: Every Day    Packs/day: 1.50    Years: 25.00    Additional pack years: 0.00    Total pack years: 37.50    Types:  Cigarettes   Smokeless tobacco: Never  Vaping Use   Vaping Use: Never used  Substance and Sexual Activity   Alcohol use: Yes    Comment: 1 drink per month   Drug use: Never   Sexual activity: Yes  Other Topics Concern   Not on file  Social History Narrative   Not on file   Social Determinants of Health   Financial Resource Strain: Not on file  Food Insecurity: Not on file  Transportation Needs: Not on file  Physical Activity: Not on file  Stress: Not on file  Social Connections: Not on file     Review of Systems   Gen: Denies fever, chills, anorexia. Denies fatigue, weakness, weight loss.  CV: Denies chest pain, palpitations, syncope, peripheral edema, and claudication. Resp: Denies dyspnea at rest, cough, wheezing, coughing up blood, and pleurisy. GI: See HPI Derm: Denies rash, itching, dry skin Psych: Denies depression, anxiety, memory loss, confusion. No homicidal or suicidal ideation.  Heme: Denies bruising, bleeding, and enlarged lymph nodes.   Physical Exam   There were no vitals taken for this visit.  General:   Alert and oriented. No distress noted. Pleasant and cooperative.  Head:  Normocephalic and atraumatic. Eyes:  Conjuctiva clear without scleral icterus. Mouth:  Oral mucosa pink and moist. Good dentition. No lesions. Lungs:  Clear to auscultation bilaterally. No wheezes, rales, or rhonchi. No distress.  Heart:  S1, S2 present without murmurs appreciated.  Abdomen:  +BS, soft, non-tender and non-distended. No rebound or guarding. No HSM or masses noted. Rectal: *** Msk:  Symmetrical without gross deformities. Normal posture. Extremities:  Without edema. Neurologic:  Alert and  oriented x4 Psych:  Alert and cooperative. Normal mood and affect.   Assessment  Jeffery Beard is a 62 y.o. male with a history of CAD, A-fib and stroke on Eliquis, HLD, thoracic ascending aortic aneurysm, diabetes on insulin pump, sarcoidosis presenting today for  follow-up of loose stools and change in bowel habits.  Loose stools/diarrhea, abdominal pain, change in bowel habits:   PLAN   ***     Venetia Night, MSN, FNP-BC, AGACNP-BC [facility]

## 2022-06-25 ENCOUNTER — Ambulatory Visit: Payer: Managed Care, Other (non HMO) | Admitting: Gastroenterology

## 2022-06-25 ENCOUNTER — Encounter: Payer: Self-pay | Admitting: Gastroenterology

## 2023-12-24 ENCOUNTER — Other Ambulatory Visit (HOSPITAL_COMMUNITY): Payer: Self-pay | Admitting: Internal Medicine

## 2023-12-24 DIAGNOSIS — I719 Aortic aneurysm of unspecified site, without rupture: Secondary | ICD-10-CM

## 2023-12-27 ENCOUNTER — Other Ambulatory Visit (HOSPITAL_COMMUNITY): Payer: Self-pay | Admitting: Internal Medicine

## 2023-12-27 DIAGNOSIS — I719 Aortic aneurysm of unspecified site, without rupture: Secondary | ICD-10-CM
